# Patient Record
Sex: Female | Born: 1975 | Race: White | Hispanic: Yes | Marital: Married | Smoking: Never smoker
Health system: Southern US, Community
[De-identification: ages and names within clinical notes are randomized; demographics above are authoritative.]

## PROBLEM LIST (undated history)

## (undated) ENCOUNTER — Inpatient Hospital Stay (HOSPITAL_COMMUNITY): Payer: Self-pay

## (undated) DIAGNOSIS — J45909 Unspecified asthma, uncomplicated: Secondary | ICD-10-CM

## (undated) HISTORY — PX: NO PAST SURGERIES: SHX2092

---

## 1999-01-26 ENCOUNTER — Ambulatory Visit (HOSPITAL_COMMUNITY): Admission: RE | Admit: 1999-01-26 | Discharge: 1999-01-26 | Payer: Self-pay | Admitting: *Deleted

## 1999-03-21 ENCOUNTER — Ambulatory Visit (HOSPITAL_COMMUNITY): Admission: RE | Admit: 1999-03-21 | Discharge: 1999-03-21 | Payer: Self-pay | Admitting: *Deleted

## 1999-07-03 ENCOUNTER — Encounter (HOSPITAL_COMMUNITY): Admission: RE | Admit: 1999-07-03 | Discharge: 1999-07-07 | Payer: Self-pay | Admitting: Obstetrics & Gynecology

## 1999-07-05 ENCOUNTER — Inpatient Hospital Stay (HOSPITAL_COMMUNITY): Admission: AD | Admit: 1999-07-05 | Discharge: 1999-07-07 | Payer: Self-pay | Admitting: *Deleted

## 2004-06-09 ENCOUNTER — Inpatient Hospital Stay (HOSPITAL_COMMUNITY): Admission: AD | Admit: 2004-06-09 | Discharge: 2004-06-11 | Payer: Self-pay | Admitting: Obstetrics

## 2011-09-21 ENCOUNTER — Encounter (HOSPITAL_COMMUNITY): Payer: Self-pay | Admitting: *Deleted

## 2011-09-21 ENCOUNTER — Emergency Department (HOSPITAL_COMMUNITY): Payer: Self-pay

## 2011-09-21 ENCOUNTER — Emergency Department (HOSPITAL_COMMUNITY)
Admission: EM | Admit: 2011-09-21 | Discharge: 2011-09-21 | Disposition: A | Discharge status: Home / Self Care | Payer: Self-pay | Attending: Emergency Medicine | Admitting: Emergency Medicine

## 2011-09-21 ENCOUNTER — Other Ambulatory Visit: Payer: Self-pay

## 2011-09-21 DIAGNOSIS — R0602 Shortness of breath: Secondary | ICD-10-CM | POA: Insufficient documentation

## 2011-09-21 DIAGNOSIS — R110 Nausea: Secondary | ICD-10-CM | POA: Insufficient documentation

## 2011-09-21 DIAGNOSIS — F411 Generalized anxiety disorder: Secondary | ICD-10-CM | POA: Insufficient documentation

## 2011-09-21 DIAGNOSIS — R918 Other nonspecific abnormal finding of lung field: Secondary | ICD-10-CM | POA: Insufficient documentation

## 2011-09-21 DIAGNOSIS — R9389 Abnormal findings on diagnostic imaging of other specified body structures: Secondary | ICD-10-CM

## 2011-09-21 DIAGNOSIS — R079 Chest pain, unspecified: Secondary | ICD-10-CM | POA: Insufficient documentation

## 2011-09-21 LAB — POCT I-STAT, CHEM 8
BUN: 9 mg/dL (ref 6–23)
Calcium, Ion: 1.22 mmol/L (ref 1.12–1.32)
Chloride: 106 mEq/L (ref 96–112)
Creatinine, Ser: 0.6 mg/dL (ref 0.50–1.10)
Glucose, Bld: 99 mg/dL (ref 70–99)

## 2011-09-21 LAB — HCG, SERUM, QUALITATIVE: Preg, Serum: NEGATIVE

## 2011-09-21 MED ORDER — AZITHROMYCIN 250 MG PO TABS: 250.0000 mg | ORAL_TABLET | Freq: Once | ORAL | Status: AC

## 2011-09-21 MED ORDER — LORAZEPAM 1 MG PO TABS: 1.0000 mg | ORAL_TABLET | Freq: Four times a day (QID) | ORAL | Status: AC | PRN

## 2011-09-21 MED ORDER — LORAZEPAM 1 MG PO TABS
1.0000 mg | ORAL_TABLET | Freq: Once | ORAL | Status: AC
  Administered 2011-09-21: 1 mg via ORAL
  Filled 2011-09-21: qty 1

## 2011-09-21 MED ORDER — IOHEXOL 350 MG/ML SOLN
100.0000 mL | Freq: Once | INTRAVENOUS | Status: AC | PRN
  Administered 2011-09-21: 100 mL via INTRAVENOUS

## 2011-09-21 NOTE — ED Notes (Signed)
Pt refuses ABG from respiratory tech, Scarlette [place], [place] notified.

## 2011-09-21 NOTE — ED Notes (Signed)
Pt and family requesting CT scan.

## 2011-09-21 NOTE — Discharge Instructions (Signed)
Your x-ray was initially read as a questionable area in the right lung which could be a sign of early infection.  Were going to treat her with some antibiotics on but would like for you to come back in 2 weeks for another x-ray.

## 2011-09-21 NOTE — ED Notes (Signed)
Rx x 2, pt voiced understanding to f/u with referred MD next week.

## 2011-09-21 NOTE — ED Notes (Signed)
Per the daughter who the patient requested to translate,  Patient with complaints of chest pain and sob when she is eating.  She reports it feels like her food gets stuck and she cannot breath.  She also complains of diff having bm,  She has to force/push a lot.  She also reports nausea at times

## 2011-09-21 NOTE — ED Notes (Signed)
Pt feels that her food is not going through her digestive tract.  She thinks it is stuck in her esophageus area and in the LUQ area.   She is normally in pain and states that her pain right now is a 5/10 but increases when she eats.  Denies any N/V but does have difficulty with bowel movements.  Upon palpation on her abd, there is a mass in the LUQ area.

## 2011-09-21 NOTE — ED Provider Notes (Signed)
History     CSN: [id number]  Arrival date & time 09/21/11  1212   First MD Initiated Contact with Patient 09/21/11 1442      Chief Complaint  Patient presents with  . Shortness of Breath  . Chest Pain  . Nausea    HPI Per the daughter who the patient requested to translate, Patient with complaints of chest pain and sob when she is eating. She reports it feels like her food gets stuck and she cannot breath. She also complains of diff having bm, She has to force/push a lot. She also reports nausea at times  History reviewed. No pertinent past medical history.  History reviewed. No pertinent past surgical history.  No family history on file.  History  Substance Use Topics  . Smoking status: Never Smoker   . Smokeless tobacco: Not on file  . Alcohol Use: No    OB History    Grav Para Term Preterm Abortions TAB SAB Ect Mult Living                  Review of Systems  Unable to perform ROS: Other   (language barrier)  Allergies  Review of patient's allergies indicates no known allergies.  Home Medications   Current Outpatient Rx  Name Route Sig Dispense Refill  . FLUOXETINE HCL 10 MG PO CAPS Oral Take 10 mg by mouth daily.    . TRAZODONE HCL 100 MG PO TABS Oral Take 100 mg by mouth at bedtime.    . AZITHROMYCIN 250 MG PO TABS Oral Take 1 tablet (250 mg total) by mouth once. 2 tablets on day one and then one tablet a day until completely gone 6 tablet 0  . LORAZEPAM 1 MG PO TABS Oral Take 1 tablet (1 mg total) by mouth every 6 (six) hours as needed for anxiety. 30 tablet 0    BP 104/63  Pulse 78  Temp(Src) 98.2 F (36.8 C) (Oral)  Resp 18  Ht 5\' 4"  (1.626 m)  SpO2 99%  Physical Exam  Nursing note and vitals reviewed. Constitutional: She is oriented to person, place, and time. She appears well-developed and well-nourished. No distress.  HENT:  Head: Normocephalic and atraumatic.  Eyes: Pupils are equal, round, and reactive to light.  Neck: Normal range of  motion.  Cardiovascular: Normal rate and intact distal pulses.          Date: 09/21/2011  Rate: 79  Rhythm: normal sinus rhythm  QRS Axis: normal  Intervals: normal  ST/T Wave abnormalities: normal (poor R. wave progression across the anterior leads)  Conduction Disutrbances:none:   Old EKG Reviewed: none available      Pulmonary/Chest: No respiratory distress. She has no wheezes. She has no rales.  Abdominal: Normal appearance. She exhibits no distension. There is no tenderness. There is no rebound and no guarding.  Musculoskeletal: Normal range of motion.  Neurological: She is alert and oriented to person, place, and time. No cranial nerve deficit.  Skin: Skin is warm and dry. No rash noted.  Psychiatric: She has a normal mood and affect. Her behavior is normal.    ED Course  Procedures (including critical care time)   Labs Reviewed  HCG, SERUM, QUALITATIVE  POCT I-STAT, CHEM 8  POCT I-STAT TROPONIN I   Ct Angio Chest W/cm &/or Wo Cm  09/21/2011  *RADIOLOGY REPORT*  Clinical Data: Shortness of breath, dysphagia.  CT ANGIOGRAPHY CHEST  Technique:  Multidetector CT imaging of the chest using the  standard protocol during bolus administration of intravenous contrast. Multiplanar reconstructed images including MIPs were obtained and reviewed to evaluate the vascular anatomy.  Contrast: OMNIPAQUE IOHEXOL 350 MG/ML IV SOLN  Comparison: Chest x-ray earlier today.  Findings: No filling defects in the pulmonary arteries to suggest pulmonary emboli.  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  There is a small branch from the left lower lobe which courses normally into the left atrium.  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 This tangle of vessels in the left lower lobe appears to be predominately  represent pulmonary venous structures, draining both into the left atrium and superiorly into the anomalous vein draining the remainder of the left lung into the left innominate vein.  On the right, a portion of the right upper lobe pulmonary vein drains into both the anomalous left pulmonary vein that drains into the left innominate vein as well as into the left atrium. The remainder of the right lung drain normally into the left atrium.  The right side of the heart is prominent, likely due to the high flow state in the right system.  Flattening/straightening of the interventricular septum suggest increased right heart pressures.  Lungs are clear.  No pleural effusions. Visualized thyroid and chest wall soft tissues unremarkable. No mediastinal, hilar, or axillary adenopathy.  Imaging into the upper abdomen shows no acute findings.  No gross abnormality in the region of the esophagus to explain the patient's dysphagia.  IMPRESSION: No evidence of pulmonary embolus.  Anomalous pulmonary venous anatomy.  Almost all of the left lung drain is by an anomalous vessel in the left side of the mediastinum into the left innominate vein.  A small portion of the left lower lobe drains normally into the left atrium.  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 This malformation appears to communicate to both the left atrium and to the abnormal vein draining much of the left lung into the left innominate vein. Similarly, on the right, a portion of the right upper lobe drains anomalously into the large left pulmonary vein draining into the left innominate vein as well as into the left atrium. Consider thoracic surgical consultation.  Original Report Authenticated By: Cyndie Chime, M.D.   Dg Abd Acute W/chest  09/21/2011  *RADIOLOGY REPORT*  Clinical Data: Shortness of breath and chest pain.  ACUTE ABDOMEN SERIES (ABDOMEN 2 VIEW & CHEST 1 VIEW)  Comparison: None.   Findings: Frontal view of the chest shows ill definition of the right hilar shadows.  Left lung is clear. The cardiopericardial silhouette is within normal limits for size.  Left heart border and left hemidiaphragm are very well defined, but no pleural line can be identified to suggest the presence of a pneumothorax.  Upright film shows no evidence for intraperitoneal free air.  There is no evidence for gaseous bowel dilation to suggest obstruction. No unexpected abdominopelvic calcification.  The visualized bony structures are unremarkable.  IMPRESSION: Ill definition of the right hilum.  This could be related to some central atelectasis or infiltrate.  Consider short-term interval follow-up two-view chest x-ray to further evaluate.  No evidence for bowel obstruction or perforation.  Original Report Authenticated By: ERIC A. MANSELL, M.D.     1. Abnormal chest x-ray  MDM  Initially the patient was going to be discharged home with a course of antibiotics and return for another x-ray in 2 weeks.  The husband is very reluctant he stated they had been to many many doctors he requested a CT scan.  Because the abnormal chest x-ray and her shortness of breath I feel like a CT angiogram would be the best exam to rule out serious pathology.  If that is negative I told the husband would most likely would discharge with something for anxiety and followup with a physician.  I turned the case over to F. Sanford to review angio and dispo accordingly.      Nelia Shi, MD 09/21/11 [id number]

## 2011-09-21 NOTE — ED Provider Notes (Signed)
History     CSN: [id number]  Arrival date & time 09/21/11  1212   First MD Initiated Contact with Patient 09/21/11 1442      Chief Complaint  Patient presents with  . Shortness of Breath  . Chest Pain  . Nausea    (Consider location/radiation/quality/duration/timing/severity/associated sxs/prior treatment) HPI  History reviewed. No pertinent past medical history.  History reviewed. No pertinent past surgical history.  No family history on file.  History  Substance Use Topics  . Smoking status: Never Smoker   . Smokeless tobacco: Not on file  . Alcohol Use: No    OB History    Grav Para Term Preterm Abortions TAB SAB Ect Mult Living                  Review of Systems  Allergies  Review of patient's allergies indicates no known allergies.  Home Medications   Current Outpatient Rx  Name Route Sig Dispense Refill  . FLUOXETINE HCL 10 MG PO CAPS Oral Take 10 mg by mouth daily.    . TRAZODONE HCL 100 MG PO TABS Oral Take 100 mg by mouth at bedtime.    . AZITHROMYCIN 250 MG PO TABS Oral Take 1 tablet (250 mg total) by mouth once. 2 tablets on day one and then one tablet a day until completely gone 6 tablet 0    BP 104/63  Pulse 78  Temp(Src) 98.2 F (36.8 C) (Oral)  Resp 18  Ht 5\' 4"  (1.626 m)  SpO2 99%  Physical Exam  ED Course  Procedures (including critical care time)   Labs Reviewed  HCG, SERUM, QUALITATIVE  POCT I-STAT, CHEM 8  POCT I-STAT TROPONIN I   Ct Angio Chest W/cm &/or Wo Cm  09/21/2011  *RADIOLOGY REPORT*  Clinical Data: Shortness of breath, dysphagia.  CT ANGIOGRAPHY CHEST  Technique:  Multidetector CT imaging of the chest using the standard protocol during bolus administration of intravenous contrast. Multiplanar reconstructed images including MIPs were obtained and reviewed to evaluate the vascular anatomy.  Contrast: OMNIPAQUE IOHEXOL 350 MG/ML IV SOLN  Comparison: Chest x-ray earlier today.  Findings: No filling defects in the  pulmonary arteries to suggest pulmonary emboli.  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  There is a small branch from the left lower lobe which courses normally into the left atrium.  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 This tangle of vessels in the left lower lobe appears to be predominately represent pulmonary venous structures, draining both into the left atrium and superiorly into the anomalous vein draining the remainder of the left lung into the left innominate vein.  On the right, a portion of the right upper lobe pulmonary vein drains into both the anomalous left pulmonary vein that drains into the left innominate vein as well as into the left atrium. The remainder of the right lung drain normally into the left atrium.  The right side of the heart is prominent, likely due to the high flow state in the right system.  Flattening/straightening of the interventricular septum suggest increased right heart pressures.  Lungs are clear.  No pleural effusions. Visualized thyroid and chest wall soft tissues unremarkable. No mediastinal, hilar, or axillary adenopathy.  Imaging into the upper abdomen shows no acute findings.  No gross abnormality in the region  of the esophagus to explain the patient's dysphagia.  IMPRESSION: No evidence of pulmonary embolus.  Anomalous pulmonary venous anatomy.  Almost all of the left lung drain is by an anomalous vessel in the left side of the mediastinum into the left innominate vein.  A small portion of the left lower lobe drains normally into the left atrium.  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 This malformation appears to communicate to both the left atrium and to the abnormal vein draining much of the left lung  into the left innominate vein. Similarly, on the right, a portion of the right upper lobe drains anomalously into the large left pulmonary vein draining into the left innominate vein as well as into the left atrium. Consider thoracic surgical consultation.  Original Report Authenticated By: Cyndie Chime, M.D.   Dg Abd Acute W/chest  09/21/2011  *RADIOLOGY REPORT*  Clinical Data: Shortness of breath and chest pain.  ACUTE ABDOMEN SERIES (ABDOMEN 2 VIEW & CHEST 1 VIEW)  Comparison: None.  Findings: Frontal view of the chest shows ill definition of the right hilar shadows.  Left lung is clear. The cardiopericardial silhouette is within normal limits for size.  Left heart border and left hemidiaphragm are very well defined, but no pleural line can be identified to suggest the presence of a pneumothorax.  Upright film shows no evidence for intraperitoneal free air.  There is no evidence for gaseous bowel dilation to suggest obstruction. No unexpected abdominopelvic calcification.  The visualized bony structures are unremarkable.  IMPRESSION: Ill definition of the right hilum.  This could be related to some central atelectasis or infiltrate.  Consider short-term interval follow-up two-view chest x-ray to further evaluate.  No evidence for bowel obstruction or perforation.  Original Report Authenticated By: ERIC A. MANSELL, M.D.   Results for orders placed during the hospital encounter of 09/21/11  HCG, SERUM, QUALITATIVE      Component Value Range   Preg, Serum NEGATIVE  NEGATIVE   POCT I-STAT, CHEM 8      Component Value Range   Sodium 142  135 - 145 (mEq/L)   Potassium 3.6  3.5 - 5.1 (mEq/L)   Chloride 106  96 - 112 (mEq/L)   BUN 9  6 - 23 (mg/dL)   Creatinine, Ser 1.61  0.50 - 1.10 (mg/dL)   Glucose, Bld 99  70 - 99 (mg/dL)   Calcium, Ion 0.96  0.45 - 1.32 (mmol/L)   TCO2 25  0 - 100 (mmol/L)   Hemoglobin 12.9  12.0 - 15.0 (g/dL)   HCT 40.9  81.1 - 91.4 (%)  POCT I-STAT TROPONIN I      Component  Value Range   Troponin i, poc 0.00  0.00 - 0.08 (ng/mL)   Comment 3            Ct Angio Chest W/cm &/or Wo Cm  09/21/2011  *RADIOLOGY REPORT*  Clinical Data: Shortness of breath, dysphagia.  CT ANGIOGRAPHY CHEST  Technique:  Multidetector CT imaging of the chest using the standard protocol during bolus administration of intravenous contrast. Multiplanar reconstructed images including MIPs were obtained and reviewed to evaluate the vascular anatomy.  Contrast: OMNIPAQUE IOHEXOL 350 MG/ML IV SOLN  Comparison: Chest x-ray earlier today.  Findings: No filling defects in the pulmonary arteries to suggest pulmonary emboli.  Anomalous pulmonary venous anatomy noted.  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  There is a small branch from the left lower lobe which courses normally into the left atrium.  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 This tangle of vessels in the left lower lobe appears to be predominately represent pulmonary venous structures, draining both into the left atrium and superiorly into the anomalous vein draining the remainder of the left lung into the left innominate vein.  On the right, a portion of the right upper lobe pulmonary vein drains into both the anomalous left pulmonary vein that drains into the left innominate vein as well as into the left atrium. The remainder of the right lung drain normally into the left atrium.  The right side of the heart is prominent, likely due to the high flow state in the right system.  Flattening/straightening of the interventricular septum suggest increased right heart pressures.  Lungs are clear.  No pleural effusions. Visualized thyroid and chest wall soft tissues unremarkable. No mediastinal, hilar, or axillary adenopathy.  Imaging into the upper abdomen shows no acute findings.  No gross abnormality  in the region of the esophagus to explain the patient's dysphagia.  IMPRESSION: No evidence of pulmonary embolus.  Anomalous pulmonary venous anatomy.  Almost all of the left lung drain is by an anomalous vessel in the left side of the mediastinum into the left innominate vein.  A small portion of the left lower lobe drains normally into the left atrium.  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 This malformation appears to communicate to both the left atrium and to the abnormal vein draining much of the left lung into the left innominate vein. Similarly, on the right, a portion of the right upper lobe drains anomalously into the large left pulmonary vein draining into the left innominate vein as well as into the left atrium. Consider thoracic surgical consultation.  Original Report Authenticated By: Cyndie Chime, M.D.   Dg Abd Acute W/chest  09/21/2011  *RADIOLOGY REPORT*  Clinical Data: Shortness of breath and chest pain.  ACUTE ABDOMEN SERIES (ABDOMEN 2 VIEW & CHEST 1 VIEW)  Comparison: None.  Findings: Frontal view of the chest shows ill definition of the right hilar shadows.  Left lung is clear. The cardiopericardial silhouette is within normal limits for size.  Left heart border and left hemidiaphragm are very well defined, but no pleural line can be identified to suggest the presence of a pneumothorax.  Upright film shows no evidence for intraperitoneal free air.  There is no evidence for gaseous bowel dilation to suggest obstruction. No unexpected abdominopelvic calcification.  The visualized bony structures are unremarkable.  IMPRESSION: Ill definition of the right hilum.  This could be related to some central atelectasis or infiltrate.  Consider short-term interval follow-up two-view chest x-ray to further evaluate.  No evidence for bowel obstruction or perforation.  Original Report Authenticated By: ERIC A. MANSELL, M.D.      1.  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  They recommend CT surgery evaluation on an outpatient basis.  Though I doubt this is the cause of the patient's symptoms, this could be.  Will prescribe a short course  of ativan for anxiety.  Oxygen saturation is normal so I doubt this to be the cause of the issue.        Izola Price [place], [place] 09/21/11 2149

## 2013-06-25 NOTE — L&D Delivery Note (Signed)
Delivery Note At 10:27 AM a viable female was delivered via Vaginal, Spontaneous Delivery (Presentation: Left Occiput Anterior).  APGAR: 9, 9; weight 7 lb 9.2 oz (3436 g).   Placenta status: Intact, Spontaneous.  Cord: 3 vessels with the following complications: None.  Anesthesia: None  Episiotomy: None Lacerations: 1st degree;Perineal Suture Repair: none Est. Blood Loss (mL): 200  Mom to postpartum.  Baby to Couplet care / Skin to Skin.  Stephanie Mcglone ROCIO 04/21/2014, 9:53 PM

## 2013-12-17 ENCOUNTER — Other Ambulatory Visit (HOSPITAL_COMMUNITY): Payer: Self-pay | Admitting: Nurse Practitioner

## 2013-12-17 DIAGNOSIS — Z3689 Encounter for other specified antenatal screening: Secondary | ICD-10-CM

## 2013-12-17 LAB — OB RESULTS CONSOLE HIV ANTIBODY (ROUTINE TESTING): HIV: NONREACTIVE

## 2013-12-17 LAB — OB RESULTS CONSOLE RPR: RPR: NONREACTIVE

## 2013-12-17 LAB — OB RESULTS CONSOLE RUBELLA ANTIBODY, IGM: Rubella: IMMUNE

## 2013-12-17 LAB — OB RESULTS CONSOLE HEPATITIS B SURFACE ANTIGEN: HEP B S AG: NEGATIVE

## 2013-12-22 ENCOUNTER — Ambulatory Visit (HOSPITAL_COMMUNITY)
Admission: RE | Admit: 2013-12-22 | Discharge: 2013-12-22 | Disposition: A | Discharge status: Home / Self Care | Payer: Medicaid Other | Source: Ambulatory Visit | Attending: Nurse Practitioner | Admitting: Nurse Practitioner

## 2013-12-22 ENCOUNTER — Other Ambulatory Visit (HOSPITAL_COMMUNITY): Payer: Self-pay | Admitting: Nurse Practitioner

## 2013-12-22 DIAGNOSIS — Z3689 Encounter for other specified antenatal screening: Secondary | ICD-10-CM

## 2013-12-28 ENCOUNTER — Other Ambulatory Visit (HOSPITAL_COMMUNITY): Payer: Self-pay | Admitting: Nurse Practitioner

## 2013-12-28 DIAGNOSIS — Z0489 Encounter for examination and observation for other specified reasons: Secondary | ICD-10-CM

## 2013-12-28 DIAGNOSIS — IMO0002 Reserved for concepts with insufficient information to code with codable children: Secondary | ICD-10-CM

## 2014-01-07 LAB — OB RESULTS CONSOLE GC/CHLAMYDIA
Chlamydia: NEGATIVE
Gonorrhea: NEGATIVE

## 2014-01-19 ENCOUNTER — Ambulatory Visit (HOSPITAL_COMMUNITY): Admission: RE | Admit: 2014-01-19 | Payer: MEDICAID | Source: Ambulatory Visit

## 2014-01-21 ENCOUNTER — Other Ambulatory Visit (HOSPITAL_COMMUNITY): Payer: Self-pay | Admitting: Nurse Practitioner

## 2014-01-21 ENCOUNTER — Ambulatory Visit (HOSPITAL_COMMUNITY)
Admission: RE | Admit: 2014-01-21 | Discharge: 2014-01-21 | Disposition: A | Discharge status: Home / Self Care | Payer: Medicaid Other | Source: Ambulatory Visit | Attending: Nurse Practitioner | Admitting: Nurse Practitioner

## 2014-01-21 DIAGNOSIS — Z0000 Encounter for general adult medical examination without abnormal findings: Secondary | ICD-10-CM

## 2014-01-21 DIAGNOSIS — Z3689 Encounter for other specified antenatal screening: Secondary | ICD-10-CM | POA: Diagnosis present

## 2014-02-06 IMAGING — CT CT ANGIO CHEST
2 of 6 series · 18 of 46 positions shown · IV contrast (APPLIED)
Comparison: Chest x-ray earlier today.

CLINICAL DATA: Shortness of breath, dysphagia.

CT ANGIOGRAPHY CHEST
TECHNIQUE: Multidetector CT imaging of the chest using the
standard protocol during bolus administration of intravenous
contrast. Multiplanar reconstructed images including MIPs were
obtained and reviewed to evaluate the vascular anatomy.
Contrast: 100mL OMNIPAQUE IOHEXOL 350 MG/ML IV SOLN

[Series 6: pulm embolism 1.0 b25f thin · axial · 0.63mm/px · z∈[-399,-152]mm · 15 of 271 slices shown]
[im 12/271  lung]
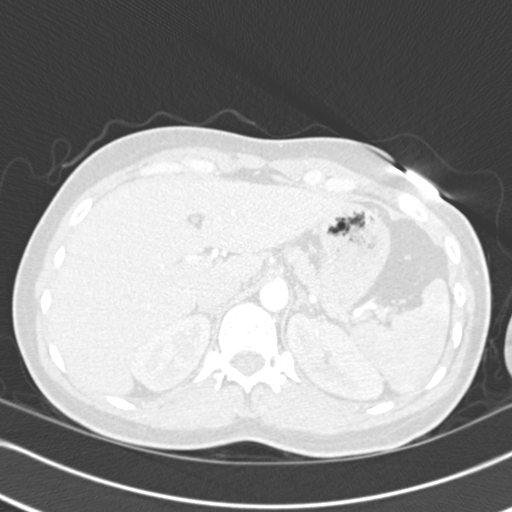
[im 36/271  soft-tissue]
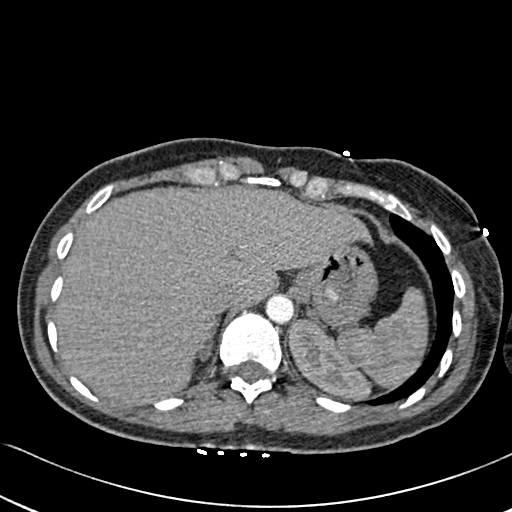
[im 47/271  lung]
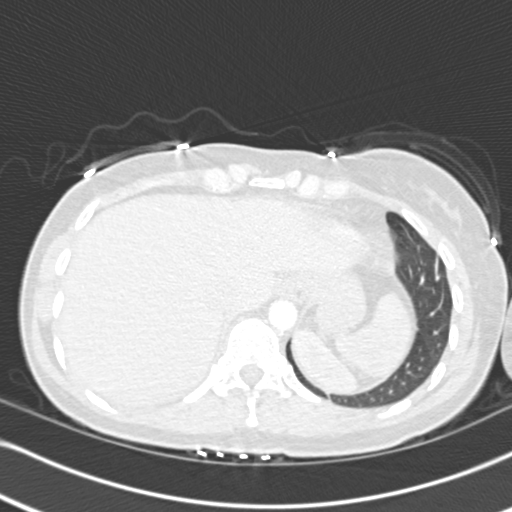
[im 71/271  soft-tissue]
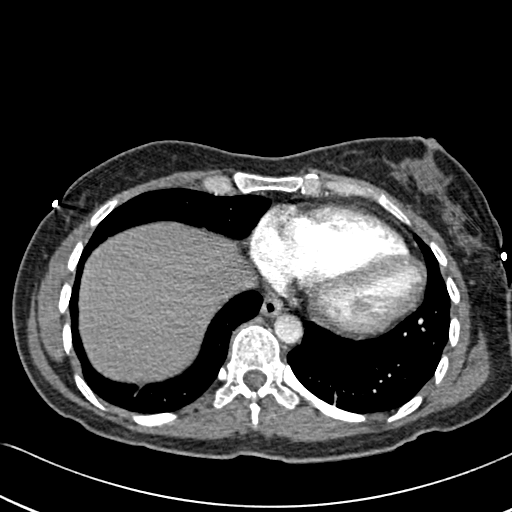
[im 83/271  lung]
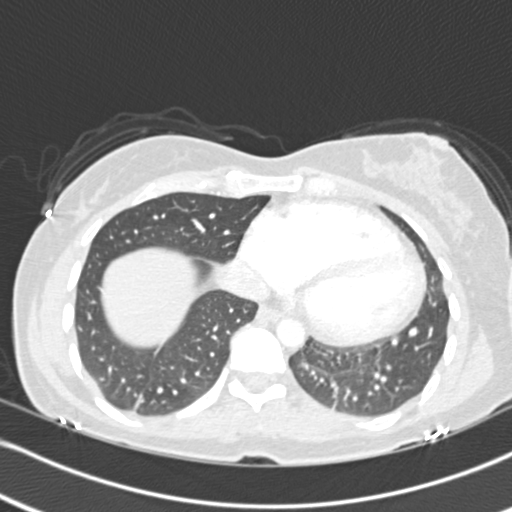
[im 106/271  soft-tissue]
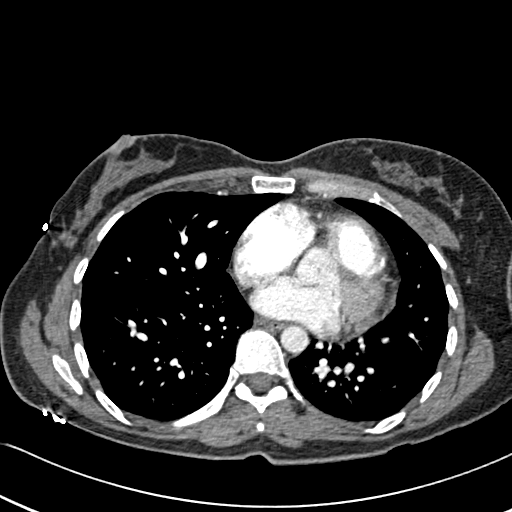
[im 118/271  lung]
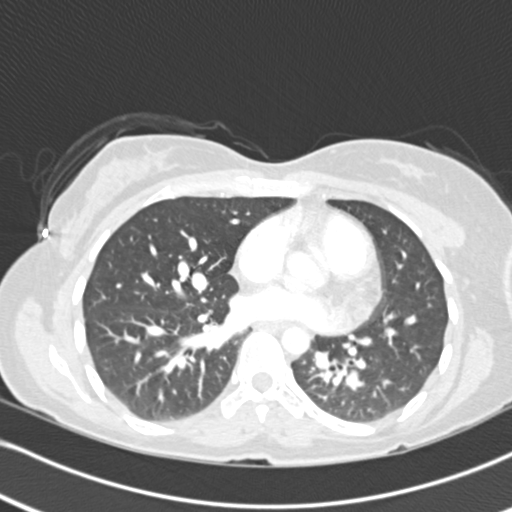
[im 141/271  soft-tissue]
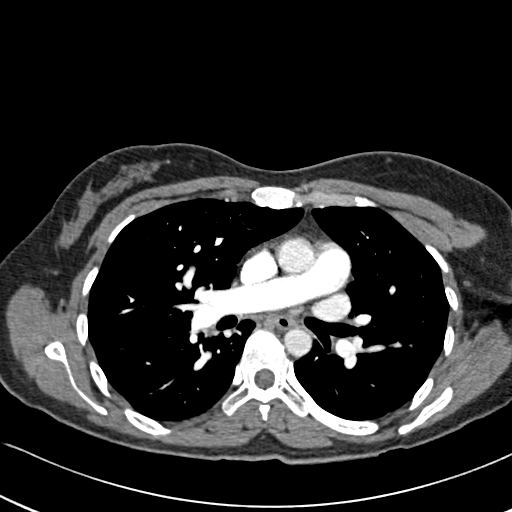
[im 153/271  lung]
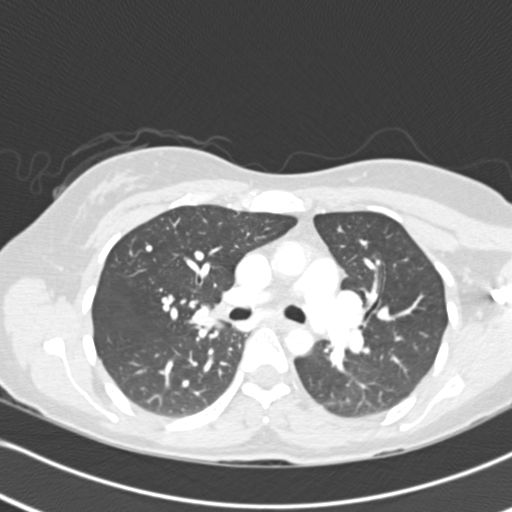
[im 165/271  soft-tissue]
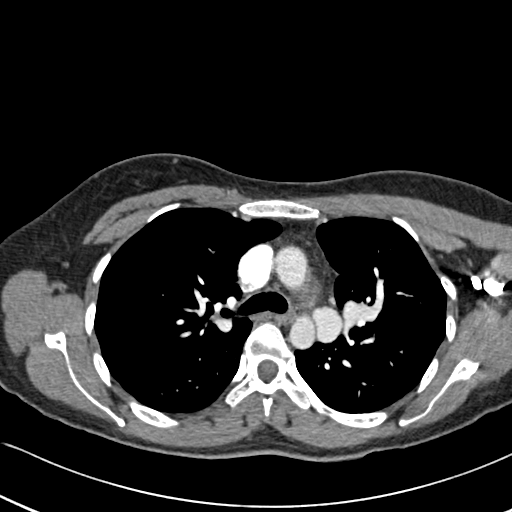
[im 188/271  lung]
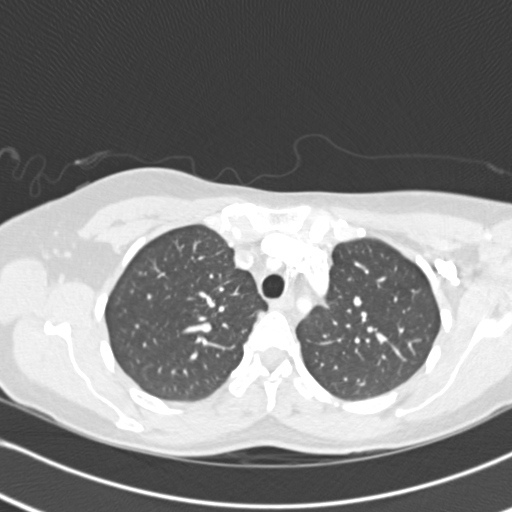
[im 200/271  soft-tissue]
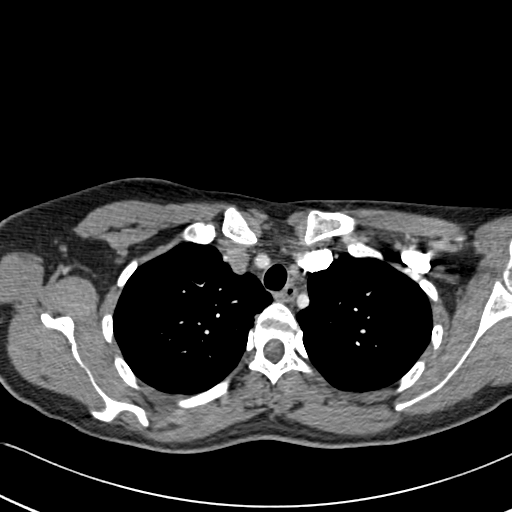
[im 224/271  lung]
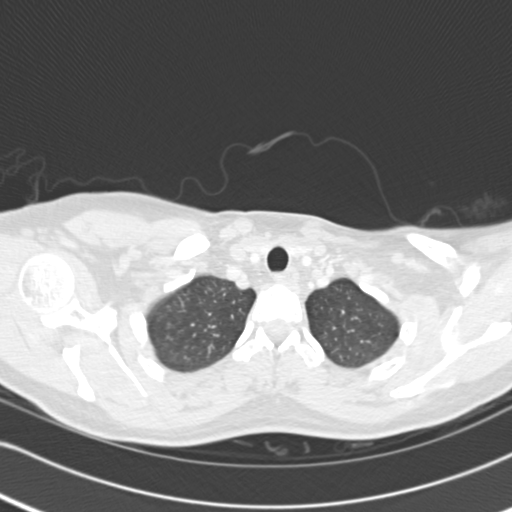
[im 235/271  soft-tissue]
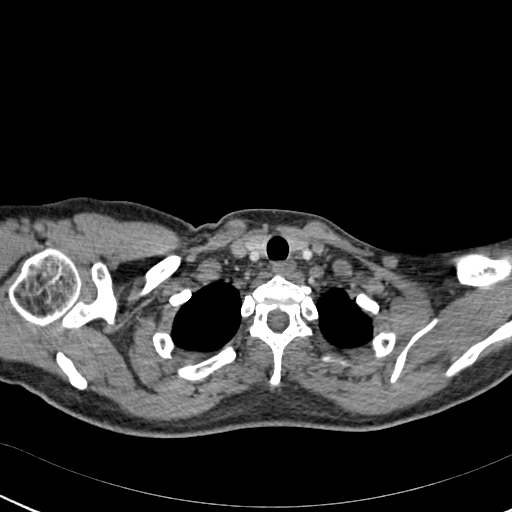
[im 259/271  lung]
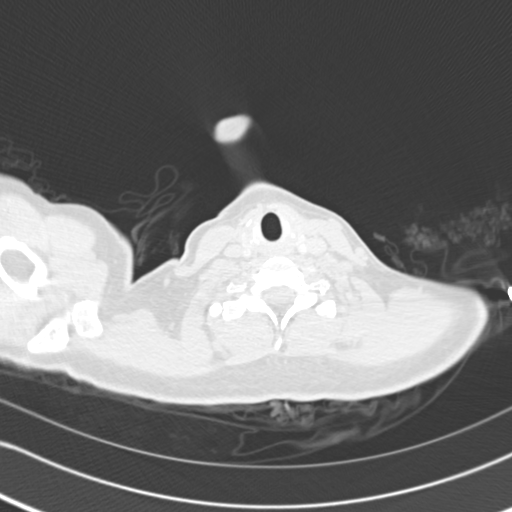

[Series 602: coronal · coronal · 0.63mm/px · 3 of 93 slices shown]
[im 24/93  soft-tissue]
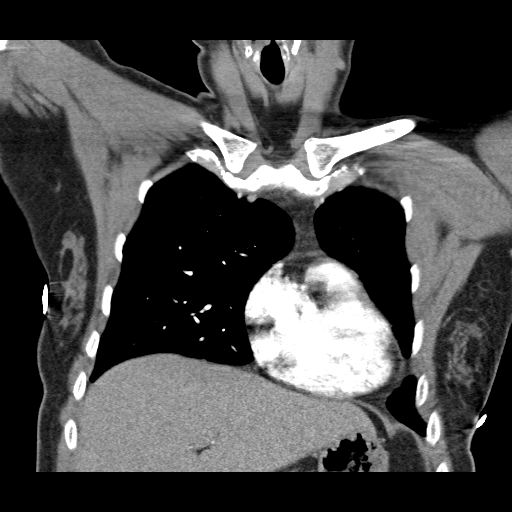
[im 47/93  soft-tissue]
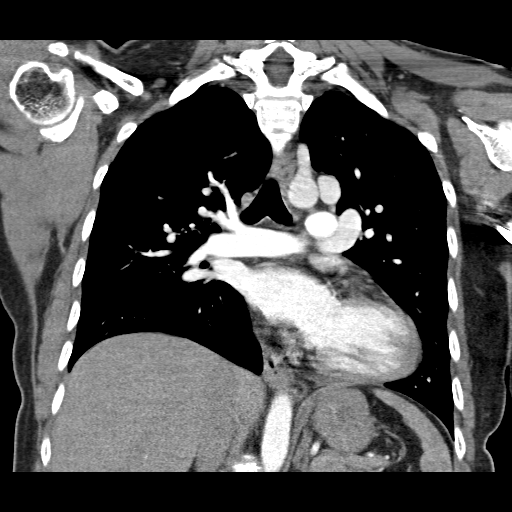
[im 70/93  soft-tissue]
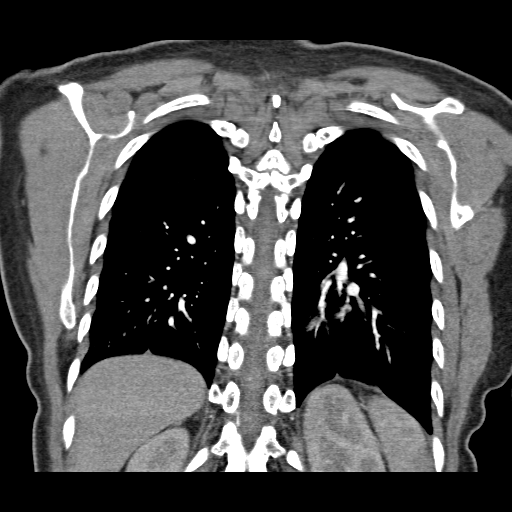

[18 of 46 positions shown; findings below may reference images not displayed]

FINDINGS: No filling defects in the pulmonary arteries to suggest
pulmonary emboli.

Anomalous pulmonary venous anatomy noted.  Near total anomalous
pulmonary venous return of the left lung draining into the left
innominate vein.  There is a small branch from the left lower lobe
which courses normally into the left atrium.  This vessel however
arises from a tangle of vessels in the left lower lobe compatible
with left lower lobe vascular malformation. This tangle of vessels
in the left lower lobe appears to be predominately represent
pulmonary venous structures, draining both into the left atrium and
superiorly into the anomalous vein draining the remainder of the
left lung into the left innominate vein.  On the right, a portion
of the right upper lobe pulmonary vein drains into both the
anomalous left pulmonary vein that drains into the left innominate
vein as well as into the left atrium. The remainder of the right
lung drain normally into the left atrium.  The right side of the
heart is prominent, likely due to the high flow state in the right
system.  Flattening/straightening of the interventricular septum
suggest increased right heart pressures.

Lungs are clear.  No pleural effusions. Visualized thyroid and
chest wall soft tissues unremarkable. No mediastinal, hilar, or
axillary adenopathy.  Imaging into the upper abdomen shows no acute
findings.  No gross abnormality in the region of the esophagus to
explain the patient's dysphagia.
IMPRESSION: No evidence of pulmonary embolus.

Anomalous pulmonary venous anatomy.  Almost all of the left lung
drain is by an anomalous vessel in the left side of the mediastinum
into the left innominate vein.  A small portion of the left lower
lobe drains normally into the left atrium.  However, this vessel
arises from a tangle of vessels in the left lower lobe, likely
vascular malformation. This malformation appears to communicate to
both the left atrium and to the abnormal vein draining much of the
left lung into the left innominate vein. Similarly, on the right, a
portion of the right upper lobe drains anomalously into the large
left pulmonary vein draining into the left innominate vein as well
as into the left atrium. Consider thoracic surgical consultation.

## 2014-02-18 ENCOUNTER — Encounter (HOSPITAL_COMMUNITY): Payer: Self-pay | Admitting: Pediatrics

## 2014-02-18 ENCOUNTER — Inpatient Hospital Stay (HOSPITAL_COMMUNITY)
Admission: AD | Admit: 2014-02-18 | Discharge: 2014-02-18 | Disposition: A | Discharge status: Home / Self Care | Payer: Medicaid Other | Source: Ambulatory Visit | Attending: Obstetrics & Gynecology | Admitting: Obstetrics & Gynecology

## 2014-02-18 ENCOUNTER — Inpatient Hospital Stay (HOSPITAL_COMMUNITY): Payer: Medicaid Other

## 2014-02-18 DIAGNOSIS — N939 Abnormal uterine and vaginal bleeding, unspecified: Secondary | ICD-10-CM

## 2014-02-18 DIAGNOSIS — O4690 Antepartum hemorrhage, unspecified, unspecified trimester: Secondary | ICD-10-CM | POA: Diagnosis not present

## 2014-02-18 DIAGNOSIS — N898 Other specified noninflammatory disorders of vagina: Secondary | ICD-10-CM

## 2014-02-18 DIAGNOSIS — O4700 False labor before 37 completed weeks of gestation, unspecified trimester: Secondary | ICD-10-CM | POA: Diagnosis not present

## 2014-02-18 HISTORY — DX: Unspecified asthma, uncomplicated: J45.909

## 2014-02-18 LAB — RAPID URINE DRUG SCREEN, HOSP PERFORMED
Amphetamines: NOT DETECTED
BARBITURATES: NOT DETECTED
Benzodiazepines: NOT DETECTED
COCAINE: NOT DETECTED
Opiates: NOT DETECTED
Tetrahydrocannabinol: NOT DETECTED

## 2014-02-18 LAB — CBC
HEMATOCRIT: 29.9 % — AB (ref 36.0–46.0)
Hemoglobin: 9.7 g/dL — ABNORMAL LOW (ref 12.0–15.0)
MCH: 28.3 pg (ref 26.0–34.0)
MCHC: 32.4 g/dL (ref 30.0–36.0)
MCV: 87.2 fL (ref 78.0–100.0)
PLATELETS: 208 10*3/uL (ref 150–400)
RBC: 3.43 MIL/uL — AB (ref 3.87–5.11)
RDW: 15 % (ref 11.5–15.5)
WBC: 8.7 10*3/uL (ref 4.0–10.5)

## 2014-02-18 LAB — URINALYSIS, ROUTINE W REFLEX MICROSCOPIC
BILIRUBIN URINE: NEGATIVE
Glucose, UA: NEGATIVE mg/dL
Hgb urine dipstick: NEGATIVE
KETONES UR: 40 mg/dL — AB
LEUKOCYTES UA: NEGATIVE
NITRITE: NEGATIVE
PH: 5.5 (ref 5.0–8.0)
PROTEIN: NEGATIVE mg/dL
Specific Gravity, Urine: 1.025 (ref 1.005–1.030)
UROBILINOGEN UA: 0.2 mg/dL (ref 0.0–1.0)

## 2014-02-18 LAB — ABO/RH: ABO/RH(D): O POS

## 2014-02-18 LAB — WET PREP, GENITAL
CLUE CELLS WET PREP: NONE SEEN
Trich, Wet Prep: NONE SEEN
Yeast Wet Prep HPF POC: NONE SEEN

## 2014-02-18 NOTE — MAU Note (Signed)
Pt states she went to the clinic this morning and when she came home she noticed blood in her urine and in her underwear around 1545. She denies pain or cramping.

## 2014-02-18 NOTE — MAU Note (Signed)
Fetus moving , changing position. FH monitor, repositioned.

## 2014-02-18 NOTE — MAU Note (Signed)
Baby tracing breech at this time

## 2014-02-18 NOTE — Discharge Instructions (Signed)
Hemorragia vaginal durante el embarazo (tercer trimestre) °(Vaginal Bleeding During Pregnancy, Third Trimester) °Durante el embarazo es relativamente frecuente que se presente una pequeña hemorragia (manchas). Hay diversos factores que pueden causar hemorragia o manchas en el embarazo. A veces, la hemorragia es normal y no es un problema. No obstante, si esto sucede durante el tercer trimestre, puede ser un signo de afección grave de la madre y el bebé. Debe informar a su médico de inmediato si tiene alguna hemorragia vaginal.  °Algunas causas posibles de hemorragia vaginal durante el tercer trimestre incluyen:  °· La placenta puede estar cubriendo la apertura del útero de manera total o parcial (placenta previa). °· La placenta puede haberse separado del útero (abrupción placentaria). °· Puede haber infección o una masa en el cuello del útero. °· Puede ser que se esté iniciando el trabajo de parto, entonces se denomina "pérdida del tapón mucoso". °· La placenta puede desarrollarse en la capa muscular del útero (placenta acreta). °INSTRUCCIONES PARA EL CUIDADO EN EL HOGAR  °Controle su afección para ver si hay cambios. Las siguientes indicaciones ayudarán a aliviar cualquier molestia que pueda sentir:  °· Siga las indicaciones del médico para restringir su actividad. Si el médico le indica descanso en la cama, debe quedarse en la cama y levantarse solo para ir al baño. No obstante, el médico puede permitirle que continúe realizando tareas livianas. °· Si es necesario, organícese para que alguien le ayude con las actividades y responsabilidades cotidianas mientras está en cama. °· Lleve un registro de la cantidad y el grado de saturación de las toallas higiénicas que utiliza cada día. Anote este dato. °· No use tampones.No se haga duchas vaginales. °· No tenga relaciones sexuales u orgasmos hasta que el médico la autorice. °· Siga los consejos del médico acerca de levantar objetos pesados, conducir y realizar  actividad física. °· Si elimina tejido por la vagina, guárdelo para mostrárselo al médico. °· Tome solo medicamentos de venta libre o recetados, según las indicaciones del médico. °· No  tome aspirina, ya que puede causar hemorragias. °· Cumpla con todas las visitas de control, según le indique su médico. °SOLICITE ATENCIÓN MÉDICA SI: °· Tiene un sangrado vaginal en cualquier momento del embarazo. °· Tiene calambres o dolores de parto. °· Tiene fiebre y no puede bajarla con medicamentos. °SOLICITE ATENCIÓN MÉDICA DE INMEDIATO SI:  °· Siente calambres intensos o dolor en la espalda o en el vientre (abdomen). °· Tiene escalofríos. °· Tiene una pérdida de líquido por la vagina. °· Elimina coágulos grandes o tejido por la vagina. °· La hemorragia aumenta. °· Se siente mareada o débil. °· Se desmaya. °· Siente que el bebé se mueve menos o no se mueve en absoluto. °ASEGÚRESE DE QUE: °· Comprende estas instrucciones. °· Controlará su afección. °· Recibirá ayuda de inmediato si no mejora o si empeora. °Document Released: 03/21/2005 Document Revised: 06/16/2013 °ExitCare® Patient Information ©2015 [employer]. This information is not intended to replace advice given to you by your health care provider. Make sure you discuss any questions you have with your health care provider. ° °

## 2014-02-18 NOTE — MAU Provider Note (Signed)
Chief Complaint:  Vaginal Bleeding   First Provider Initiated Contact with Patient 02/18/14 1745      HPI: Mackenzie Mitchell is a 38 y.o. G3P2002 pt of [facility] at 28 weeks who presents to maternity admissions reporting light vaginal bleeding when wiping.  She was seen at health dept for prenatal visit today and started having spotting when wiping after her visit.  She did not have a cervical exam during the visit and denies intercourse or anything per vagina x48 hours.  She also reports lower abdominal pain described as bilateral inguinal pain, which is intermittent and sharp, coming occasionally.  She reports good fetal movement, denies LOF, vaginal itching/burning, urinary symptoms, h/a, dizziness, n/v, or fever/chills.      Past Medical History: Past Medical History  Diagnosis Date  . Asthma     Past obstetric history: OB History  Gravida Para Term Preterm AB SAB TAB Ectopic Multiple Living  3         2    # Outcome Date GA Lbr Len/2nd Weight Sex Delivery Anes PTL Lv  3 GRA           2 GRA           1 GRA               Past Surgical History: Past Surgical History  Procedure Laterality Date  . No past surgeries      Family History: Family History  Problem Relation Age of Onset  . Diabetes Mother   . Cancer Father     Social History: History  Substance Use Topics  . Smoking status: Never Smoker   . Smokeless tobacco: Not on file  . Alcohol Use: No    Allergies: No Known Allergies  Meds:  Prescriptions prior to admission  Medication Sig Dispense Refill  . Prenatal Vit-Fe Fumarate-FA (PRENATAL MULTIVITAMIN) TABS tablet Take 1 tablet by mouth daily at 12 noon.        ROS: Pertinent findings in history of present illness.  Physical Exam  Blood pressure 117/86, pulse 83, temperature 98 F (36.7 C), temperature source Oral, resp. rate 16. GENERAL: Well-developed, well-nourished female in no acute distress.  HEENT: normocephalic HEART: normal rate RESP: normal  effort ABDOMEN: Soft, non-tender, gravid appropriate for gestational age EXTREMITIES: Nontender, no edema NEURO: alert and oriented Pelvic exam: Cervix pink, visually closed, without lesion, scant white creamy discharge, No blood noted, vaginal walls and external genitalia normal   Cervix 0/long/high, posterior, no blood on glove following exam  FHT:  Baseline 135 , moderate variability, accelerations present, no decelerations  While pt on EFM in MAU, FHR became difficult to trace for several minutes, when FHR found, there was an audible arrythmia  Contractions: mild irritability upon arrival in MAU, some irregular contractions, mild to palpation 3-4 hours after arriving in MAU   Labs: Results for orders placed during the hospital encounter of 02/18/14 (from the past 24 hour(s))  URINALYSIS, ROUTINE W REFLEX MICROSCOPIC     Status: Abnormal   Collection Time    02/18/14  3:05 PM      Result Value Ref Range   Color, Urine YELLOW  YELLOW   APPearance CLOUDY (*) CLEAR   Specific Gravity, Urine 1.025  1.005 - 1.030   pH 5.5  5.0 - 8.0   Glucose, UA NEGATIVE  NEGATIVE mg/dL   Hgb urine dipstick NEGATIVE  NEGATIVE   Bilirubin Urine NEGATIVE  NEGATIVE   Ketones, ur 40 (*) NEGATIVE mg/dL  Protein, ur NEGATIVE  NEGATIVE mg/dL   Urobilinogen, UA 0.2  0.0 - 1.0 mg/dL   Nitrite NEGATIVE  NEGATIVE   Leukocytes, UA NEGATIVE  NEGATIVE  WET PREP, GENITAL     Status: Abnormal   Collection Time    02/18/14  5:05 PM      Result Value Ref Range   Yeast Wet Prep HPF POC NONE SEEN  NONE SEEN   Trich, Wet Prep NONE SEEN  NONE SEEN   Clue Cells Wet Prep HPF POC NONE SEEN  NONE SEEN   WBC, Wet Prep HPF POC FEW (*) NONE SEEN  CBC     Status: Abnormal   Collection Time    02/18/14  5:24 PM      Result Value Ref Range   WBC 8.7  4.0 - 10.5 K/uL   RBC 3.43 (*) 3.87 - 5.11 MIL/uL   Hemoglobin 9.7 (*) 12.0 - 15.0 g/dL   HCT 16.1 (*) 09.6 - 04.5 %   MCV 87.2  78.0 - 100.0 fL   MCH 28.3  26.0 - 34.0  pg   MCHC 32.4  30.0 - 36.0 g/dL   RDW 40.9  81.1 - 91.4 %   Platelets 208  150 - 400 K/uL  ABO/RH     Status: None   Collection Time    02/18/14  5:24 PM      Result Value Ref Range   ABO/RH(D) O POS      BPP pending Report to Dr Gayla Doss and Jaynie Crumble, CNM at 8:30 pm  Sharen Counter Certified Nurse-Midwife 02/18/2014 8:38 PM   10:46 PM US shows normal fluid, normal cervix, no abruption or previa.  Reactive NST.  Pt d/c'd home in stable condition  CRESENZO-DISHMAN,Jenniferlynn Saad

## 2014-02-19 LAB — GC/CHLAMYDIA PROBE AMP
CT Probe RNA: NEGATIVE
GC PROBE AMP APTIMA: NEGATIVE

## 2014-02-22 NOTE — MAU Provider Note (Signed)
Attestation of Attending Supervision of Advanced Practitioner (PA/CNM/NP): Evaluation and management procedures were performed by the Advanced Practitioner under my supervision and collaboration.  I have reviewed the Advanced Practitioner's note and chart, and I agree with the management and plan.  Cambrea Kirt, MD, FACOG Attending Obstetrician & Gynecologist Faculty Practice, [facility]   

## 2014-04-06 LAB — OB RESULTS CONSOLE GBS: STREP GROUP B AG: POSITIVE

## 2014-04-20 ENCOUNTER — Encounter (HOSPITAL_COMMUNITY): Payer: Self-pay | Admitting: *Deleted

## 2014-04-20 ENCOUNTER — Inpatient Hospital Stay (HOSPITAL_COMMUNITY)
Admission: AD | Admit: 2014-04-20 | Discharge: 2014-04-22 | DRG: 775 | Disposition: A | Discharge status: Home / Self Care | Payer: Medicaid Other | Source: Ambulatory Visit | Attending: Obstetrics and Gynecology | Admitting: Obstetrics and Gynecology

## 2014-04-20 DIAGNOSIS — O09523 Supervision of elderly multigravida, third trimester: Secondary | ICD-10-CM | POA: Diagnosis present

## 2014-04-20 DIAGNOSIS — O99824 Streptococcus B carrier state complicating childbirth: Secondary | ICD-10-CM | POA: Diagnosis present

## 2014-04-20 DIAGNOSIS — IMO0001 Reserved for inherently not codable concepts without codable children: Secondary | ICD-10-CM

## 2014-04-20 DIAGNOSIS — Z3A38 38 weeks gestation of pregnancy: Secondary | ICD-10-CM | POA: Diagnosis present

## 2014-04-20 LAB — CBC
HEMATOCRIT: 32.9 % — AB (ref 36.0–46.0)
HEMOGLOBIN: 10.6 g/dL — AB (ref 12.0–15.0)
MCH: 27.5 pg (ref 26.0–34.0)
MCHC: 32.2 g/dL (ref 30.0–36.0)
MCV: 85.2 fL (ref 78.0–100.0)
Platelets: 195 10*3/uL (ref 150–400)
RBC: 3.86 MIL/uL — AB (ref 3.87–5.11)
RDW: 17.2 % — ABNORMAL HIGH (ref 11.5–15.5)
WBC: 10.9 10*3/uL — ABNORMAL HIGH (ref 4.0–10.5)

## 2014-04-20 LAB — TYPE AND SCREEN
ABO/RH(D): O POS
Antibody Screen: NEGATIVE

## 2014-04-20 LAB — RPR

## 2014-04-20 MED ORDER — ONDANSETRON HCL 4 MG/2ML IJ SOLN: 4.0000 mg | INTRAMUSCULAR | Status: DC | PRN

## 2014-04-20 MED ORDER — ZOLPIDEM TARTRATE 5 MG PO TABS: 5.0000 mg | ORAL_TABLET | Freq: Every evening | ORAL | Status: DC | PRN

## 2014-04-20 MED ORDER — OXYTOCIN BOLUS FROM INFUSION: 500.0000 mL | INTRAVENOUS | Status: DC

## 2014-04-20 MED ORDER — ACETAMINOPHEN 325 MG PO TABS: 650.0000 mg | ORAL_TABLET | ORAL | Status: DC | PRN

## 2014-04-20 MED ORDER — DIPHENHYDRAMINE HCL 25 MG PO CAPS: 25.0000 mg | ORAL_CAPSULE | Freq: Four times a day (QID) | ORAL | Status: DC | PRN

## 2014-04-20 MED ORDER — SIMETHICONE 80 MG PO CHEW: 80.0000 mg | CHEWABLE_TABLET | ORAL | Status: DC | PRN

## 2014-04-20 MED ORDER — LACTATED RINGERS IV SOLN: 500.0000 mL | INTRAVENOUS | Status: DC | PRN

## 2014-04-20 MED ORDER — ONDANSETRON HCL 4 MG/2ML IJ SOLN: 4.0000 mg | Freq: Four times a day (QID) | INTRAMUSCULAR | Status: DC | PRN

## 2014-04-20 MED ORDER — LACTATED RINGERS IV SOLN
INTRAVENOUS | Status: DC
  Administered 2014-04-20: 08:00:00 via INTRAVENOUS

## 2014-04-20 MED ORDER — PRENATAL MULTIVITAMIN CH: 1.0000 | ORAL_TABLET | Freq: Every day | ORAL | Status: DC

## 2014-04-20 MED ORDER — TETANUS-DIPHTH-ACELL PERTUSSIS 5-2.5-18.5 LF-MCG/0.5 IM SUSP: 0.5000 mL | Freq: Once | INTRAMUSCULAR | Status: DC

## 2014-04-20 MED ORDER — DIBUCAINE 1 % RE OINT: 1.0000 "application " | TOPICAL_OINTMENT | RECTAL | Status: DC | PRN

## 2014-04-20 MED ORDER — LIDOCAINE HCL (PF) 1 % IJ SOLN
30.0000 mL | INTRAMUSCULAR | Status: AC | PRN
  Administered 2014-04-20: 30 mL via SUBCUTANEOUS
  Filled 2014-04-20: qty 30

## 2014-04-20 MED ORDER — DEXTROSE 5 % IV SOLN
5.0000 10*6.[IU] | Freq: Once | INTRAVENOUS | Status: DC
  Filled 2014-04-20: qty 5

## 2014-04-20 MED ORDER — DEXTROSE 5 % IV SOLN
2.5000 10*6.[IU] | INTRAVENOUS | Status: DC
  Filled 2014-04-20 (×2): qty 2.5

## 2014-04-20 MED ORDER — LANOLIN HYDROUS EX OINT: TOPICAL_OINTMENT | CUTANEOUS | Status: DC | PRN

## 2014-04-20 MED ORDER — PRENATAL MULTIVITAMIN CH
1.0000 | ORAL_TABLET | Freq: Every day | ORAL | Status: DC
  Administered 2014-04-21 – 2014-04-22 (×2): 1 via ORAL
  Filled 2014-04-20 (×2): qty 1

## 2014-04-20 MED ORDER — OXYCODONE-ACETAMINOPHEN 5-325 MG PO TABS: 1.0000 | ORAL_TABLET | ORAL | Status: DC | PRN

## 2014-04-20 MED ORDER — INFLUENZA VAC SPLIT QUAD 0.5 ML IM SUSY: 0.5000 mL | PREFILLED_SYRINGE | INTRAMUSCULAR | Status: DC

## 2014-04-20 MED ORDER — SODIUM CHLORIDE 0.9 % IV SOLN
2.0000 g | Freq: Four times a day (QID) | INTRAVENOUS | Status: DC
  Administered 2014-04-20: 2 g via INTRAVENOUS
  Filled 2014-04-20 (×2): qty 2000

## 2014-04-20 MED ORDER — SENNOSIDES-DOCUSATE SODIUM 8.6-50 MG PO TABS
2.0000 | ORAL_TABLET | ORAL | Status: DC
  Administered 2014-04-22: 2 via ORAL
  Filled 2014-04-20 (×2): qty 2

## 2014-04-20 MED ORDER — OXYTOCIN 40 UNITS IN LACTATED RINGERS INFUSION - SIMPLE MED
62.5000 mL/h | INTRAVENOUS | Status: DC
  Administered 2014-04-20: 62.5 mL/h via INTRAVENOUS
  Filled 2014-04-20: qty 1000

## 2014-04-20 MED ORDER — CITRIC ACID-SODIUM CITRATE 334-500 MG/5ML PO SOLN: 30.0000 mL | ORAL | Status: DC | PRN

## 2014-04-20 MED ORDER — OXYCODONE-ACETAMINOPHEN 5-325 MG PO TABS: 2.0000 | ORAL_TABLET | ORAL | Status: DC | PRN

## 2014-04-20 MED ORDER — WITCH HAZEL-GLYCERIN EX PADS: 1.0000 "application " | MEDICATED_PAD | CUTANEOUS | Status: DC | PRN

## 2014-04-20 MED ORDER — ONDANSETRON HCL 4 MG PO TABS: 4.0000 mg | ORAL_TABLET | ORAL | Status: DC | PRN

## 2014-04-20 MED ORDER — BENZOCAINE-MENTHOL 20-0.5 % EX AERO
1.0000 "application " | INHALATION_SPRAY | CUTANEOUS | Status: DC | PRN
  Administered 2014-04-20: 1 via TOPICAL
  Filled 2014-04-20: qty 56

## 2014-04-20 MED ORDER — IBUPROFEN 600 MG PO TABS
600.0000 mg | ORAL_TABLET | Freq: Four times a day (QID) | ORAL | Status: DC
  Administered 2014-04-20 – 2014-04-22 (×8): 600 mg via ORAL
  Filled 2014-04-20 (×8): qty 1

## 2014-04-20 NOTE — MAU Note (Signed)
UC's since 0400

## 2014-04-20 NOTE — Progress Notes (Signed)
Admission questions asked with Dr.Acosta interpreting at bedside

## 2014-04-20 NOTE — H&P (Signed)
Mackenzie Mitchell is a 38 y.o. female G4P2 with IUP at 3450w6d presenting with active labor. Pt states she has been having regular contractions, since 0400 this morning associated with none vaginal bleeding.  Membranes are intact, with active fetal movement.   [facility]  Prenatal History/Complications: None significant.  Past Medical History: Past Medical History  Diagnosis Date  . Asthma     Past Surgical History: Past Surgical History  Procedure Laterality Date  . No past surgeries      Obstetrical History: OB History   Grav Para Term Preterm Abortions TAB SAB Ect Mult Living   4 2        2       Social History: History   Social History  . Marital Status: Married    Spouse Name: N/A    Number of Children: N/A  . Years of Education: N/A   Social History Main Topics  . Smoking status: Never Smoker   . Smokeless tobacco: Not on file  . Alcohol Use: No  . Drug Use: No  . Sexual Activity: Not on file   Other Topics Concern  . Not on file   Social History Narrative  . No narrative on file    Family History: Family History  Problem Relation Age of Onset  . Diabetes Mother   . Cancer Father     Allergies: No Known Allergies  Prescriptions prior to admission  Medication Sig Dispense Refill  . Prenatal Vit-Fe Fumarate-FA (PRENATAL MULTIVITAMIN) TABS tablet Take 1 tablet by mouth daily at 12 noon.         Review of Systems-As per HPI, otherwise all systems reviewed and are negative.  Blood pressure 121/71, pulse 81, temperature 97.4 F (36.3 C), temperature source Oral, resp. rate 18, height 5\' 2"  (1.575 m), weight 74.844 kg (165 lb). General appearance: alert and cooperative Lungs: clear to auscultation bilaterally Heart: regular rate and rhythm Abdomen: soft, non-tender; bowel sounds normal Extremities: Homans sign is negative, no sign of DVT DTR's wnl Presentation: cephalic Fetal monitoringBaseline: 140 bpm, Variability: Good {> 6 bpm),  Accelerations: Reactive and Decelerations: Absent Dilation: 5.5 Effacement (%): 90 Station: -2 Exam by:: Sarajane MarekS. Carrera, RNC   Prenatal labs: ABO, Rh: --/--/O POS (08/27 1724) Antibody:   Rubella:   RPR:    HBsAg:    HIV:    GBS:    1 hr Glucola 129 Genetic screening: Too late Anatomy US Normal   Prenatal Transfer Tool  Maternal Diabetes: No Genetic Screening: Presented too late Maternal Ultrasounds/Referrals: Normal Fetal Ultrasounds or other Referrals:  None Maternal Substance Abuse:  No Significant Maternal Medications:  None Significant Maternal Lab Results: Lab values include: Group B Strep positive   No results found for this or any previous visit (from the past 24 hour(s)).  Assessment: Mackenzie Mitchell is a 38 y.o. G4P2 at 5850w6d here for in active labor #Labor: SOL #Pain: Analgesia and anesthesia prn #FWB: Category 1 #ID:  GBS positive #MOF: Breast and Bottle #Circ:  Female baby, n/a  Jacquiline Doearker, Caleb 04/20/2014, 8:15 AM   OB fellow attestation:  I have seen and examined this patient; I agree with above documentation in the resident's note.   Mackenzie Mitchell is a 38 y.o. [phone] presented with contractions.  PE: BP 109/53  Pulse 68  Temp(Src) 98.3 F (36.8 C) (Oral)  Resp 16  Ht 5\' 2"  (1.575 m)  Wt 165 lb (74.844 kg)  BMI 30.17 kg/m2  SpO2 100%  Breastfeeding? Unknown Gen: calm  comfortable, NAD Resp: normal effort, no distress Abd: gravid  ROS, labs, PMH reviewed   Plan: - admit to birthing suites for active labor - PCN for GBS  Perry MountACOSTA,Julaine Zimny ROCIO, MD 9:45 PM

## 2014-04-21 LAB — CBC
HCT: 30 % — ABNORMAL LOW (ref 36.0–46.0)
Hemoglobin: 9.6 g/dL — ABNORMAL LOW (ref 12.0–15.0)
MCH: 27.5 pg (ref 26.0–34.0)
MCHC: 32 g/dL (ref 30.0–36.0)
MCV: 86 fL (ref 78.0–100.0)
Platelets: 189 10*3/uL (ref 150–400)
RBC: 3.49 MIL/uL — ABNORMAL LOW (ref 3.87–5.11)
RDW: 17.6 % — AB (ref 11.5–15.5)
WBC: 10.3 10*3/uL (ref 4.0–10.5)

## 2014-04-21 NOTE — Progress Notes (Addendum)
Post Partum Day 1 Subjective:  Mackenzie Mitchell is a 38 y.o. G4P1003 9960w6d s/p SVD.  No acute events overnight.  Pt denies problems with ambulating, voiding or po intake.  She denies nausea or vomiting.  Pain is well controlled.  She has had flatus. She has not had bowel movement.  Lochia Small.  Plan for birth control is undecided.  Method of Feeding: Breast  Objective: Blood pressure 104/57, pulse 64, temperature 98 F (36.7 C), temperature source Oral, resp. rate 18, height 5\' 2"  (1.575 m), weight 74.844 kg (165 lb), SpO2 100.00%, unknown if currently breastfeeding.  Physical Exam:  General: alert, cooperative and no distress Lochia:normal flow Chest: CTAB Heart: RRR no m/r/g Abdomen: +BS, soft, nontender,  Uterine Fundus: firm DVT Evaluation: No evidence of DVT seen on physical exam. Extremities: No edema   Recent Labs  04/20/14 0810  HGB 10.6*  HCT 32.9*    Assessment/Plan:  ASSESSMENT: Mackenzie Albedalberta Persons is a 38 y.o. G4P1003 6760w6d s/p SVD. GBS Positive.  Plan for discharge tomorrow   LOS: 1 day   Aldona BarKoch, Kari L 04/21/2014, 6:07 AM   Seen also by me.  AGree with note Aviva SignsMarie L Giliana Vantil, CNM

## 2014-04-22 ENCOUNTER — Encounter (HOSPITAL_COMMUNITY): Payer: Self-pay | Admitting: Advanced Practice Midwife

## 2014-04-22 NOTE — Discharge Summary (Signed)
Obstetric Discharge Summary Reason for Admission: onset of labor Prenatal Procedures: none Intrapartum Procedures: spontaneous vaginal delivery Postpartum Procedures: none Complications-Operative and Postpartum: none  Hospital Course:  Patient was admitted in active labor and progressed rapidly with no complications.  Delivery Note At 10:27 AM a viable female was delivered via Vaginal, Spontaneous Delivery (Presentation: Left Occiput Anterior). APGAR: 9, 9; weight 7 lb 9.2 oz (3436 g).  Placenta status: Intact, Spontaneous. Cord: 3 vessels with the following complications: None.  Anesthesia: None  Episiotomy: None  Lacerations: 1st degree;Perineal  Suture Repair: none  Est. Blood Loss (mL): 200  Mom to postpartum. Baby to Couplet care / Skin to Skin.  ACOSTA,KRISTY ROCIO  04/21/2014, 9:53 PM  Postpartum, the patient did well with no complications.  On the day of discharge,  Pt denied problems with ambulating, voiding or po intake.  She denied nausea or vomiting.  Pain wass well controlled.  She has had flatus. She has not had bowel movement.  Lochia Small.  Plan for birth control is condoms.    H/H: Lab Results  Component Value Date/Time   HGB 9.6* 04/21/2014  5:37 AM   HCT 30.0* 04/21/2014  5:37 AM    Filed Vitals:   04/22/14 0508  BP: 111/43  Pulse: 58  Temp: 98.5 F (36.9 C)  Resp: 17    Physical Exam: VSS NAD Abd: Appropriately tender, ND, Fundus firm No c/c/e, Neg homan's sign, neg cords Lochia Appropriate  Discharge Diagnoses: Term Pregnancy-delivered  Discharge Information: Date: 04/22/2014 Activity: pelvic rest Diet: routine  Medications: PNV Breast feeding:  Yes Condition: stable Instructions: refer to handout Discharge to: home     Medication List         prenatal multivitamin Tabs tablet  Take 1 tablet by mouth daily at 12 noon.           Follow-up Information   [facility] [place] Schedule an appointment as  soon as possible for a visit in 6 weeks. (for post partum follow up)    Contact information:   [address] [place] [id number] [phone]      Jacquiline DoeParker, Caleb 04/22/2014,11:13 AM  I have seen and examined this patient and I agree with the above. Cam HaiSHAW, KIMBERLY CNM 2:32 AM 04/24/2014

## 2014-04-22 NOTE — H&P (Signed)
Attestation of Attending Supervision of Obstetric Fellow: Evaluation and management procedures were performed by the Obstetric Fellow under my supervision and collaboration.  I have reviewed the Obstetric Fellow's note and chart, and I agree with the management and plan.  Jacob Stinson, DO Attending Physician Faculty Practice, [facility]  

## 2014-04-22 NOTE — Lactation Note (Addendum)
This note was copied from the chart of Mackenzie Mitchell. Lactation Consultation Note  Patient Name: Mackenzie Mitchell ZOXWR'UToday's Date: 04/22/2014 Reason for consult: Follow-up assessment;Other (Comment) (Spanish interpreter present ) Mackenzie Mitchell Spanish interpreter present. Per  Mom having some soreness on the right nipple. , noted a positional strip. Reviewed hand expressing, milk is in steady flow of colostrum , transitional milk. LC reviewed sore nipple and engorgement prevention and tx , referring to Baby and me booklet. Per mom active with WIC , and also Mother informed of post-discharge support and given phone number to the lactation department,  including services for phone call assistance; out-patient appointments; and breastfeeding support group. List of other breastfeeding  resources in the community given in the handout. Encouraged mother to call for problems or concerns related to breastfeeding. Assessed breast tissue and noted a positional strip , instructed on a hand pump , increased to Size #27 .    Maternal Data Has patient been taught Hand Expression?: Yes  Feeding Feeding Type: Breast Fed Length of feed: 20 min  LATCH Score/Interventions                Intervention(s): Breastfeeding basics reviewed     Lactation Tools Discussed/Used Tools: Pump;Flanges Flange Size: 27 Breast pump type: Manual WIC Program: Yes Pump Review: Setup, frequency, and cleaning;Milk Storage Initiated by:: MAI  Date initiated:: 04/22/14   Consult Status Consult Status: Complete Date: 04/22/14    Kathrin Greathouseorio, Jnai [place] Ann 04/22/2014, 1:03 PM

## 2014-04-22 NOTE — Discharge Instructions (Signed)

## 2014-04-22 NOTE — Progress Notes (Signed)
UR chart review completed.  

## 2014-04-26 ENCOUNTER — Encounter (HOSPITAL_COMMUNITY): Payer: Self-pay | Admitting: Advanced Practice Midwife

## 2016-06-08 IMAGING — US US OB FOLLOW-UP
1 series · 12 of 28 positions shown · non-contrast
Comparison: none

[Series 1: us ob follow up · 12 of 42 slices shown]
[im 2/42]
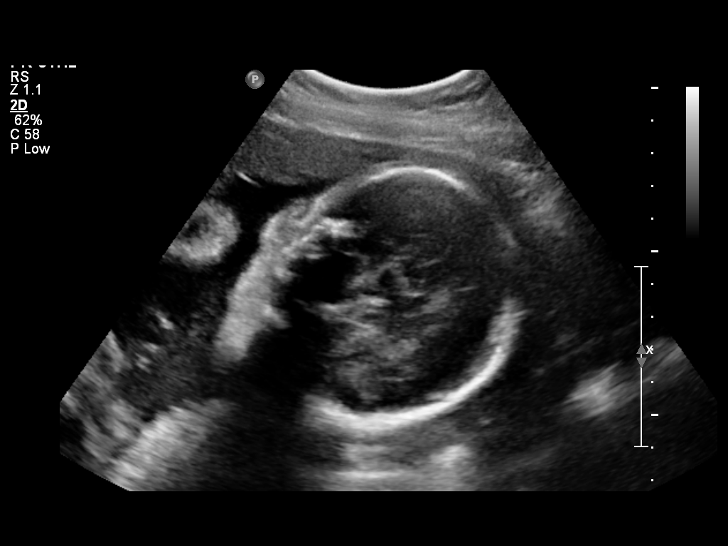
[im 5/42]
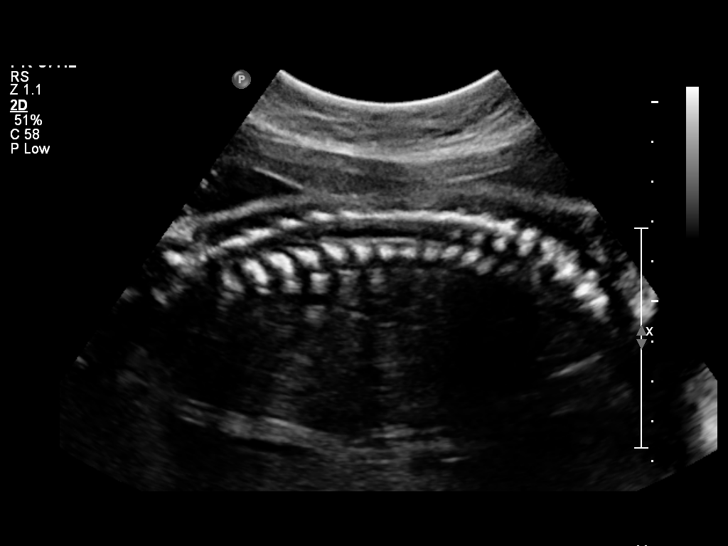
[im 8/42]
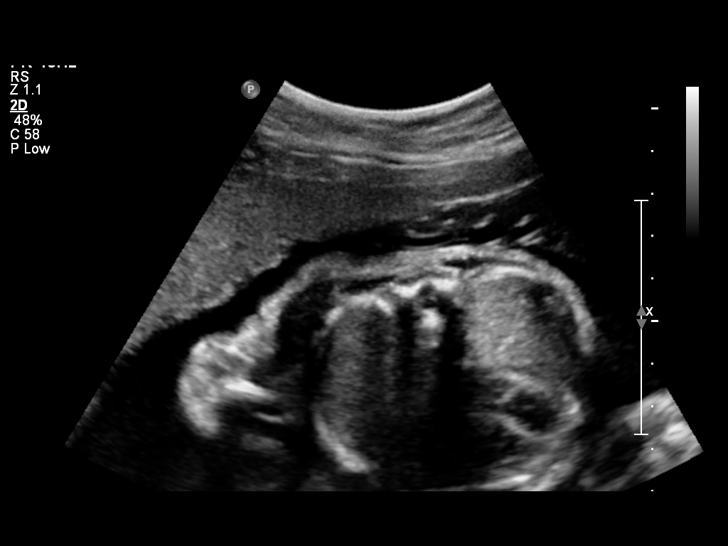
[im 13/42]
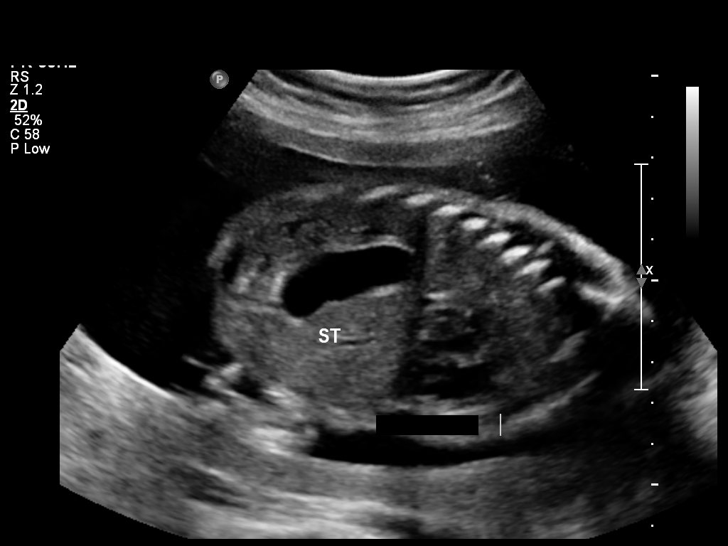
[im 16/42]
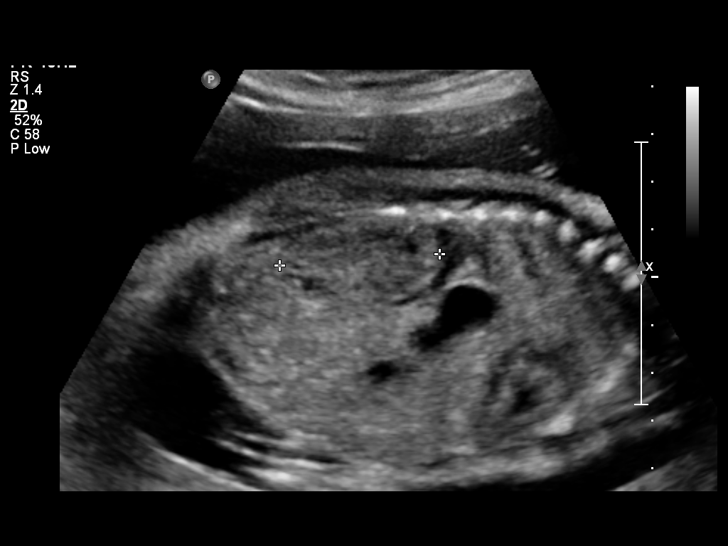
[im 19/42]
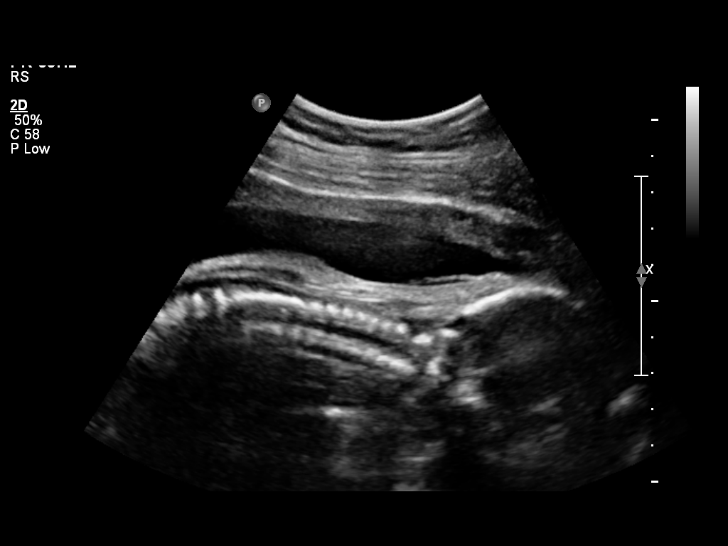
[im 23/42]
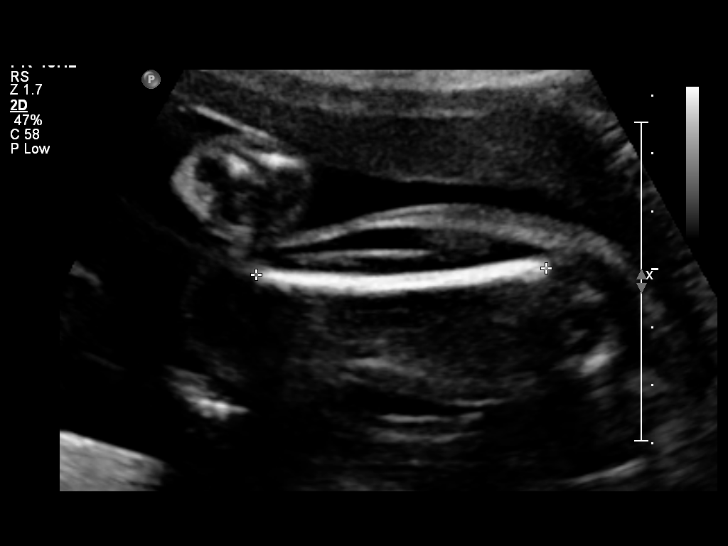
[im 26/42]
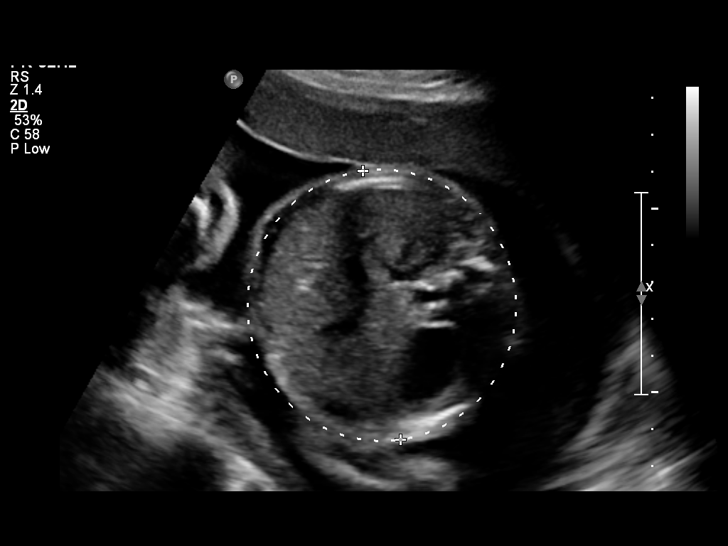
[im 29/42]
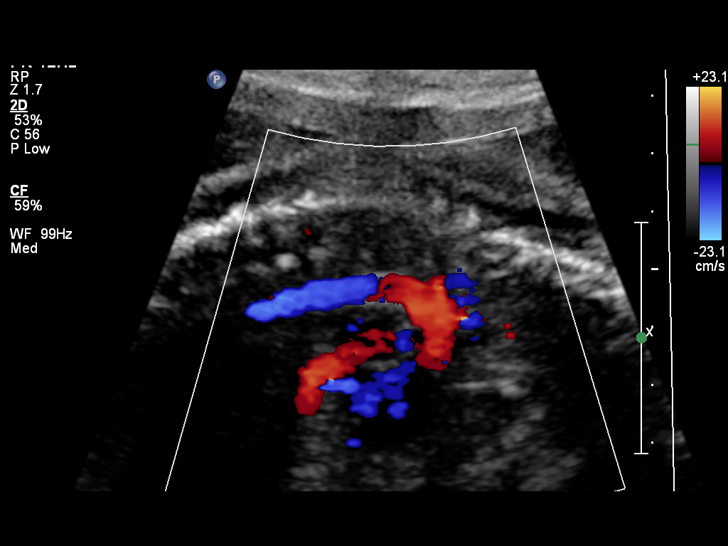
[im 34/42]
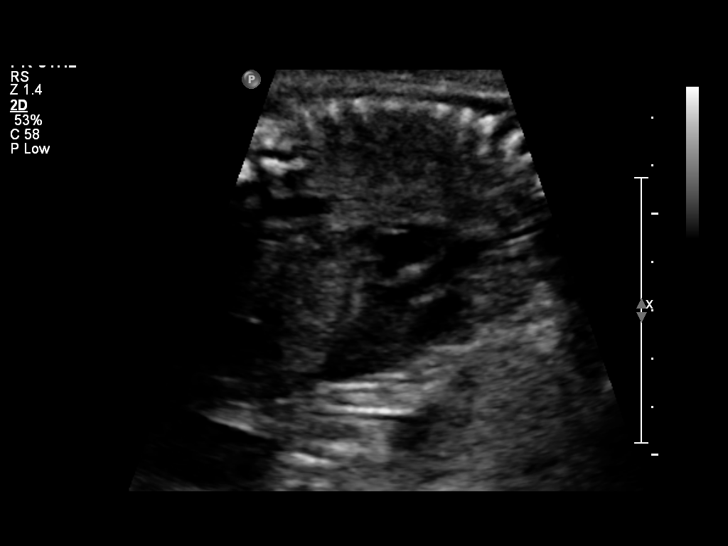
[im 37/42]
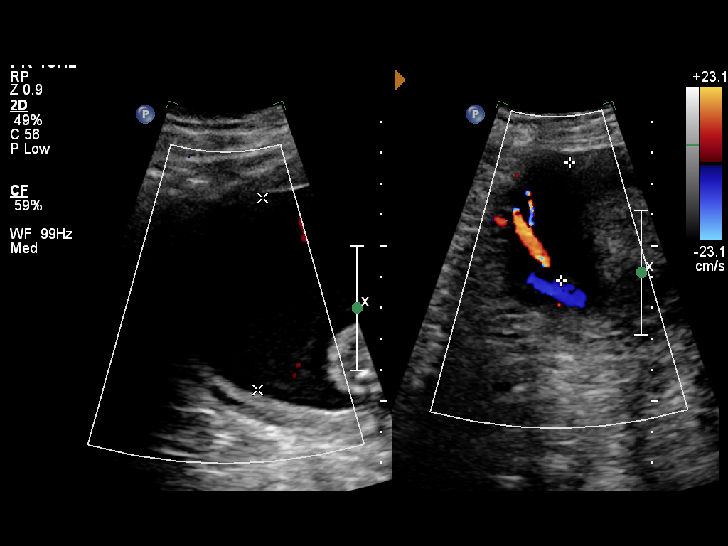
[im 40/42]
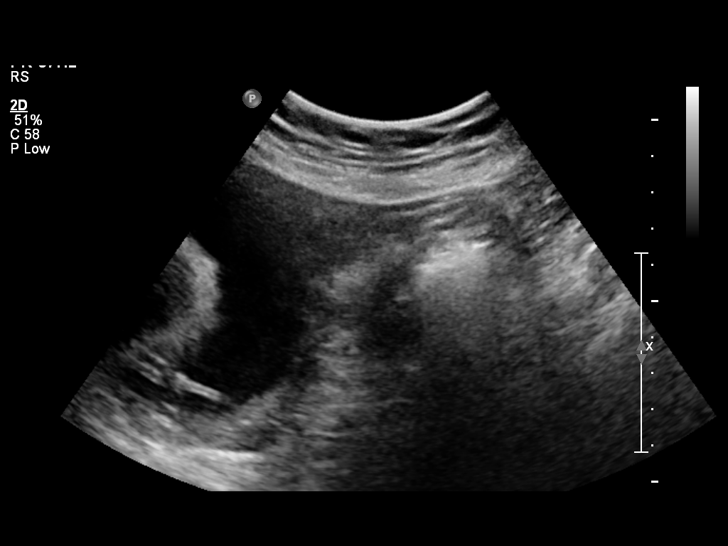

[12 of 28 positions shown; findings below may reference images not displayed]

OBSTETRICS REPORT
                      (Signed Final 01/21/2014 [DATE])

Service(s) Provided

 US OB FOLLOW UP                                       76816.1
Indications

 Evaluate anatomy not seen on prior sonogram
 No or Little Prenatal Care
 Advanced maternal age (AMA), Multigravida
Fetal Evaluation

 Num Of Fetuses:    1
 Fetal Heart Rate:  140                          bpm
 Cardiac Activity:  Observed
 Presentation:      Cephalic
 Placenta:          Anterior, above cervical os
 P. Cord            Previously Visualized
 Insertion:

 Amniotic Fluid
 AFI FV:      Subjectively within normal limits
 AFI Sum:     14.53   cm       49  %Tile     Larg Pckt:    6.19  cm
 RUQ:   6.19    cm   RLQ:    1.82   cm    LUQ:   3.82    cm   LLQ:    2.7    cm
Biometry

 BPD:     62.7  mm     G. Age:  25w 3d                CI:        70.89   70 - 86
                                                      FL/HC:      21.0   18.6 -

 HC:     237.3  mm     G. Age:  25w 6d       17  %    HC/AC:      1.04   1.04 -

 AC:     227.9  mm     G. Age:  27w 1d       73  %    FL/BPD:     79.6   71 - 87
 FL:      49.9  mm     G. Age:  26w 6d       58  %    FL/AC:      21.9   20 - 24
 HUM:     44.7  mm     G. Age:  26w 4d       55  %

 Est. FW:     987  gm      2 lb 3 oz     67  %
Gestational Age

 LMP:           26w 1d        Date:  07/22/13                 EDD:   04/28/14
 U/S Today:     26w 2d                                        EDD:   04/27/14
 Best:          26w 1d     Det. By:  LMP  (07/22/13)          EDD:   04/28/14
Anatomy

 Cranium:          Appears normal         Aortic Arch:      Appears normal
 Fetal Cavum:      Appears normal         Ductal Arch:      Previously seen
 Ventricles:       Appears normal         Diaphragm:        Appears normal
 Choroid Plexus:   Previously seen        Stomach:          Appears normal, left
                                                            sided
 Cerebellum:       Previously seen        Abdomen:          Appears normal
 Posterior Fossa:  Previously seen        Abdominal Wall:   Previously seen
 Nuchal Fold:      Not applicable (>20    Cord Vessels:     Previously seen
                   wks GA)
 Face:             Orbits previously      Kidneys:          Appear normal
                   seen
 Lips:             Previously seen        Bladder:          Appears normal
 Heart:            Previously seen        Spine:            Appears normal
 RVOT:             Previously seen        Lower             Previously seen
                                          Extremities:
 LVOT:             Previously seen        Upper             Previously seen
                                          Extremities:

 Other:  Fetus appears to be a female. Heels visualized. Technically difficult
         due to fetal position.
Targeted Anatomy

 Fetal Central Nervous System
 Lat. Ventricles:
Cervix Uterus Adnexa

 Cervical Length:    3.49     cm

 Cervix:       Normal appearance by transabdominal scan.
 Left Ovary:    Within normal limits.
 Right Ovary:   Within normal limits.
Comments

 The patient's fetal anatomic survey is now complete.  No fetal
 anomalies or soft markers of aneuploidy were seen.  Results
 of scan discussed with patient, as well as the limitations of
 U/S to detect all fetal anomalies and aneuploidies.  No further
 ultrasounds are required unless additional problems arise.
Impression

 Single living intrauterine pregnancy at 26 weeks 1 day.
 Appropriate fetal growth (67%).
 Normal amniotic fluid volume.
 Normal fetal anatomy.
 No fetal anomalies or soft markers of aneuploidy seen.
Recommendations

 Follow-up ultrasounds as clinically indicated.

                Ambre, Boubs

## 2018-01-16 ENCOUNTER — Other Ambulatory Visit: Payer: Self-pay

## 2018-01-16 ENCOUNTER — Ambulatory Visit: Payer: BLUE CROSS/BLUE SHIELD | Admitting: Urgent Care

## 2018-01-16 ENCOUNTER — Encounter: Payer: Self-pay | Admitting: Urgent Care

## 2018-01-16 VITALS — BP 98/60 | HR 76 | Temp 98.1°F | Resp 16 | Ht 62.0 in | Wt 157.0 lb

## 2018-01-16 DIAGNOSIS — R51 Headache: Secondary | ICD-10-CM

## 2018-01-16 DIAGNOSIS — R42 Dizziness and giddiness: Secondary | ICD-10-CM

## 2018-01-16 DIAGNOSIS — I959 Hypotension, unspecified: Secondary | ICD-10-CM

## 2018-01-16 DIAGNOSIS — R110 Nausea: Secondary | ICD-10-CM

## 2018-01-16 DIAGNOSIS — R519 Headache, unspecified: Secondary | ICD-10-CM

## 2018-01-16 DIAGNOSIS — B078 Other viral warts: Secondary | ICD-10-CM

## 2018-01-16 DIAGNOSIS — E860 Dehydration: Secondary | ICD-10-CM

## 2018-01-16 NOTE — Patient Instructions (Addendum)
Tome 2 litros de agua al dia para evitar deshidratacion y presion baja. Puede comer un poco mas sal para ayudar con su presion.     Hipotensin (Hypotension) La hipotensin, conocida comnmente como presin arterial baja, se produce cuando la fuerza de la sangre que bombea en las arterias es demasiado dbil. Las arterias son vasos sanguneos que transportan la sangre desde el corazn al resto del cuerpo. Cuando la presin arterial es demasiado baja, puede ser que no llegue suficiente sangre al cerebro o al resto de sus rganos. Esto puede causar debilidad, sensacin de desvanecimiento, latidos cardacos acelerados y desmayos. Segn la causa y la gravedad, la hipotensin puede ser inofensiva (benigna) o causar problemas graves (crtica). CAUSAS Las posibles causas de la hipotensin incluyen:  Prdida de [place].  Prdida de lquidos corporales (deshidratacin).  Problemas cardacos.  Problemas hormonales (endocrinos).  Embarazo.  Infeccin grave.  Falta de ciertos nutrientes.  Reacciones alrgicas graves (anafilaxis).  Ciertos medicamentos, como medicamentos para la presin arterial o medicamentos que hacen que el cuerpo pierda el exceso de lquidos (diurticos). La hipotensin suele ser la causa de no tomar los medicamentos segn lo indicado, por ejemplo, tomar demasiada cantidad de algn medicamento. FACTORES DE RIESGO Ciertos factores pueden favorecer la aparicin de la hipotensin, por ejemplo:  La edad. El riesgo aumenta a medida que envejece.  Afecciones que afectan el corazn o el sistema nervioso central.  Tomar ciertos medicamentos, como medicamentos para la presin arterial o diurticos.  Estar embarazada. SNTOMAS Los sntomas de esta afeccin pueden incluir los siguientes:  Debilidad.  Sensacin de desvanecimiento.  Mareos.  Visin borrosa.  Fatiga.  Latidos cardacos rpidos.  Desmayos, cuando los casos son graves. DIAGNSTICO Esta afeccin se  diagnostica en funcin de lo siguiente:  Sus antecedentes mdicos.  Sus sntomas.  La medicin de la presin arterial. El mdico le controlar la presin arterial cuando usted est: ? Acostado. ? Sentado. ? De pie. La lectura de la presin arterial se registra con dos nmeros, por ejemplo "120 sobre 80" (o 120/80). El primer nmero ("superior") es la presin sistlica. Es la medida de la presin de las arterias cuando el corazn late. El segundo nmero ("inferior") es la presin diastlica. Es la medida de la presin en las arterias cuando el corazn se relaja entre latidos. La presin sangunea se mide en una unidad llamada mmHg. Una presin arterial saludable para los adultos es de 120/80. Es posible que se le diagnostique hipotensin si su presin arterial est por debajo de90/60. Entre los estudios e informacin que pueden ayudar a Secondary school teacher hipotensin se incluyen los siguientes:  Sus otros signos vitales, por ejemplo la frecuencia cardaca y Retail buyer.  Anlisis de [place].  Prueba de basculacin. Para realizar la prueba, se lo sujeta de forma segura a una mesa que lo mover de una posicin [place] a una posicin vertical. Durante la prueba, se controlarn el ritmo cardaco y la presin arterial. TRATAMIENTO El tratamiento de esta afeccin puede incluir lo siguiente:  Cambios en la dieta. Estos cambios implican comer con ms sal (sodio) o tomar ms agua.  Medicamentos para elevar la presin arterial.  Cambiar la dosis de ciertos medicamentos que toma y que podran bajar su presin arterial.  Usar medias de compresin. Estas medias ayudan a Transport planner formacin de cogulos sanguneos y a Building services engineer de las piernas. En algunos casos, puede ser necesario ir al hospital para:  Reposicin de lquidos. Esto implica que continuar recibiendo lquidos por una va intravenosa.  Reposicin de sangre. Este es un procedimiento en el cual recibir sangre de un donante a  travs de una va intravenosa (transfusin).  El tratamiento de una infeccin o problemas cardacos, si corresponde.  Control. Necesitar supervisin [employer] de los medicamentos que est tomando. INSTRUCCIONES PARA EL CUIDADO EN EL HOGAR Comida y bebida  [place] suficiente lquido para mantener la orina clara o de color amarillo plido.  Mantenga una dieta saludable y siga las indicaciones del mdico respecto de las restricciones para las comidas o las bebidas. Una dieta saludable incluye: ? Nils Pyle y verduras frescas. ? Cereales integrales. ? [employer]J's. ? Productos lcteos descremados.  Consuma la sal adicional nicamente segn las indicaciones. No aada sal adicional a su dieta si no se lo indica el mdico.  Haga comidas pequeas y frecuentes.  Evite ponerse de pie de repente despus de comer. Medicamentos  [employer] de venta libre y los recetados solamente como se lo haya indicado el mdico. ? Siga las indicaciones del mdico respecto del cambio de la dosis de sus medicamentos actuales, si corresponde. ? No deje de tomar los medicamentos ni modifique la dosis por su cuenta. Instrucciones generales  Use las medias de compresin como se lo haya indicado el mdico.  Levntese despacio cuando est acostado o sentado. Esto posibilitar que la presin arterial se adapte.  Evite las duchas calientes o el calor excesivo segn las indicaciones del mdico.  Retome sus actividades normales como se lo haya indicado el mdico. Pregntele al mdico qu actividades son seguras para usted.  No consuma ningn producto que contenga nicotina o tabaco, como cigarrillos y Administrator, Civil Service. Si necesita ayuda para dejar de fumar, consulte al mdico.  Concurra a todas las visitas de control como se lo haya indicado el mdico. Esto es importante. SOLICITE ATENCIN MDICA SI:  Vomita.  Tiene diarrea.  Tiene fiebre por ms de 2 a 3 das.  Tiene ms  sed que lo habitual.  Se siente dbil y cansado. SOLICITE ATENCIN MDICA DE INMEDIATO SI:  Siente dolor en el pecho.  Tiene latidos cardacos rpidos o irregulares.  Siente adormecimiento en alguna parte del cuerpo.  No puede mover los brazos ni las piernas.  Tiene dificultad para hablar.  Est sudoroso o siente mareos.  Se desmaya.  Le falta el aire.  Tiene dificultad para mantenerse despierto.  Se siente confundido. Esta informacin no tiene Theme park manager el consejo del mdico. Asegrese de hacerle al mdico cualquier pregunta que tenga. Document Released: 07/23/2006 Document Revised: 04/01/2013 Document Reviewed: 12/01/2015 Elsevier Interactive Patient Education  2018 [employer].     Deshidratacin en los adultos Dehydration, Adult La deshidratacin es un cuadro clnico que se caracteriza por la cantidad insuficiente de lquido o agua en el organismo. Esto ocurre cuando se pierden ms lquidos de los que se ingieren. Los [employer], como los riones, el cerebro y el corazn, no pueden funcionar sin la cantidad [place] de lquidos. Cualquier prdida de lquidos del organismo puede causar deshidratacin. La deshidratacin puede ser leve o grave. Este cuadro clnico se debe tratar de inmediato para evitar que se agrave. Cules son las causas? Esta afeccin puede ser causada por lo siguiente:  Vmitos.  Diarrea.  Sudoracin excesiva, por ejemplo, por la exposicin al calor o por la actividad fsica.  Ingesta insuficiente de lquido, especialmente en estos casos: ? Cuando se est enfermo. ? Mientras se realizan actividades que exigen mucha [place].  Miccin excesiva.  [place]uts.  Una infeccin.  Determinados medicamentos, como aquellos que estimulan al cuerpo a eliminar el exceso de lquido (diurticos).  Imposibilidad de acceder a agua potable segura.  Disminucin de la capacidad fsica para obtener el agua y los alimentos [place].  Qu  incrementa el riesgo? Es ms probable que Dietitian en las personas que:  Tienen una enfermedad prolongada (crnica) que no est bien tratada, como diabetes, enfermedades cardacas o renales.  [place].  Tienen alguna discapacidad.  Viven a gran altitud.  Practican deportes de resistencia.  Cules son los signos o los sntomas? Los sntomas de la deshidratacin leve pueden incluir los siguientes:  Sed.  Labios secos.  Sequedad leve en la boca.  Piel seca y caliente.  Mareos. Los sntomas de la deshidratacin moderada pueden incluir los siguientes:  [place].  Calambres musculares.  Orina de color oscuro. La orina puede ser color t.  Disminucin de la produccin de [place].  Disminucin en la produccin de lgrimas.  Latido cardaco irregular o ms rpido que lo normal (palpitaciones).  Dolor de [place].  Sensacin de desvanecimiento, especialmente al ponerse de pie.  Desmayos (sncope). Los sntomas de deshidratacin grave pueden incluir los siguientes:  Cambios en la piel, por ejemplo: ? Piel fra y hmeda. ? Piel manchada (moteada) o plida. ? Piel que no vuelve rpidamente a la normalidad cuando se la suelta luego de Counselling psychologist (persistencia del pliegue cutneo).  Cambios en los lquidos corporales, por ejemplo: ? Mucha sed. ? Ausencia de la produccin de lgrimas. ? Incapacidad de transpirar cuando la temperatura corporal es alta, por ejemplo, cuando hace calor. ? Produccin muy escasa de [place].  Cambios en los signos vitales, por ejemplo: ? Pulso dbil. ? Pulso que supera los 100latidos por minuto cuando se est sentado quieto. ? Respiracin rpida. ? Presin arterial baja.  Otros cambios, por ejemplo: ? Ojos hundidos. ? Manos y pies fros. ? Confusin. ? Falta de energa Retail buyer). ? Dificultad para despertarse. ? Prdida de peso a Product manager. ? Prdida del conocimiento. Cmo se  diagnostica? Esta afeccin se diagnostica en funcin de los sntomas y de un examen fsico. Pueden hacerle anlisis de sangre y de orina para confirmar el diagnstico. Cmo se trata esta afeccin? El tratamiento de este cuadro clnico depende de la gravedad. La deshidratacin leve o moderada a menudo puede tratarse en la casa. El tratamiento se debe comenzar de inmediato. No espere hasta que la deshidratacin sea grave. La deshidratacin grave es Radio broadcast assistant y debe tratarse en un hospital. El tratamiento de la deshidratacin leve puede incluir lo siguiente:  Beber ms lquidos.  Reemplazar las sales y los minerales de la sangre (electrolitos) que se pueden haber perdido. El tratamiento de la deshidratacin moderada puede incluir lo siguiente:  Donia Pounds solucin de rehidratacin oral (SRO). Se trata de una bebida que lo ayuda a reponer los lquidos y Water engineer (rehidratarse). Se la puede encontrar en farmacias y tiendas minoristas. El tratamiento de la deshidratacin grave puede incluir lo siguiente:  Recibir lquidos a travs de un tubo (catter) intravenoso.  Recibir una solucin de electrolitos a travs de una sonda de alimentacin que se coloca a travs de la nariz hasta el estmago (sonda nasogstrica o sonda NG).  Corregir las [employer].  Tratar la causa preexistente de la deshidratacin. Siga estas indicaciones en su casa:  Si el mdico se lo indic, beba una SRO: ? Para preparar una SRO, siga las indicaciones del envase. ? Comience por beber  pequeas cantidades, aproximadamente taza (120ml) cada 5 a 10minutos. ? Aumente lentamente la cantidad que bebe hasta que haya ingerido la cantidad recomendada por el mdico.  Beba suficiente lquido transparente para mantener la orina clara o de color amarillo plido. Si le indicaron que beba una SRO, termine primero la solucin y luego empiece a beber lentamente otros lquidos transparentes. Beba lquidos,  por ejemplo: ? [place]. No beba solo agua. Esto puede derivar en la escasez de sal (sodio) en el organismo (hiponatremia). ? Cubitos de hielo. ? Jugo de frutas con agregado de agua (jugo de frutas diluido). ? Bebidas deportivas de bajas caloras.  Evite lo siguiente: ? Alcohol. ? Bebidas con mucha azcar. Entre estas se incluyen las bebidas deportivas ricas en caloras, el jugo de frutas sin diluir y los refrescos o gaseosas. ? Cafena. ? Alimentos muy grasos o azucarados.  Tome los medicamentos de venta libre y los recetados solamente como se lo haya indicado el mdico.  No tome comprimidos de [place]. Esto puede aumentar la concentracin de sodio en el cuerpo (hipernatremia).  Coma alimentos con un equilibrio sano de electrolitos, como bananas, naranjas, papas, tomates y espinaca.  Concurra a todas las visitas de control como se lo haya indicado el mdico. Esto es importante. Comunquese con un mdico si:  Siente dolor abdominal que: ? [place]. ? Se siente en una sola zona (localizado).  Tiene una erupcin cutnea.  Presenta rigidez en el cuello.  Se siente ms irritable o sensible de lo habitual.  Tiene ms sueo o le cuesta ms despertarse de lo habitual.  Se siente dbil o mareado.  Tiene mucha sed.  Solo ha orinado una cantidad pequea de [place] de color muy oscuro en el trmino de 6 a 8horas. Solicite ayuda de inmediato si:  Tiene sntomas de deshidratacin grave.  No puede beber lquidos sin vomitar.  Los sntomas empeoran con Scientist, research (medical)el tratamiento.  Tiene fiebre.  Tiene un dolor de cabeza intenso.  Tiene vmitos o diarrea que: ? Empeoran. ? No desaparecen.  Observa sangre o una sustancia verde (bilis) en el vmito.  Maxie Betterbserva sangre en la materia fecal. Esto puede hacer que la materia fecal sea negra y de aspecto alquitranado.  No ha orinado en el trmino de 6 a 8horas.  Se desmaya.  La frecuencia cardaca mientras est sentado quieto supera los 100latidos  por minuto.  Tiene dificultad para respirar. Esta informacin no tiene Theme park managercomo fin reemplazar el consejo del mdico. Asegrese de hacerle al mdico cualquier pregunta que tenga. Document Released: 06/11/2005 Document Revised: 09/13/2016 Document Reviewed: 08/05/2015 Elsevier Interactive Patient Education  2018 [employer].    Cox CommunicationsMareos Dizziness Los mareos son un problema muy frecuente. Se trata de una sensacin de inestabilidad o desvanecimiento. Puede sentir que se va a desmayar. Los Golden West Financialmareos pueden provocarle una lesin si se tropieza o se cae. Las Dealerpersonas de todas las edades pueden sufrir Research scientist (life sciences)mareos, Biomedical engineerpero es ms frecuente en los adultos [place] Verdemayores. Esta afeccin puede tener muchas causas, entre las que se pueden Assurantmencionar los medicamentos, la deshidratacin y [place]las enfermedades. Siga estas indicaciones en su casa: Comida y bebida  Beba suficiente lquido como para mantener la orina clara o de color amarillo plido. Esto evita la deshidratacin. Trate de beber ms lquidos transparentes, como agua.  No beba alcohol.  Limite el consumo de cafena si el mdico se lo indica. Verifique los ingredientes y la informacin nutricional para saber si un alimento o una bebida contienen cafena.  Limite el consumo de sal (sodio) si el  mdico se lo indica. Verifique los ingredientes y la informacin nutricional para saber si un alimento o una bebida contienen sodio. Actividad  Evite los movimientos rpidos. ? Levntese de las sillas con lentitud y apyese hasta sentirse bien. ? Por la maana, sintese primero a un lado de la cama. Cuando se sienta bien, pngase lentamente de [address] se sostiene de algo, hasta que sepa que ha logrado el equilibrio.  Mueva las piernas con frecuencia si debe estar de pie en un lugar durante mucho tiempo. Mientras est de pie, contraiga y relaje los msculos de las piernas.  No conduzca vehculos ni opere maquinaria pesada si se siente mareado.  Evite agacharse si se siente  mareado. En su casa, coloque los objetos de modo que le resulte fcil alcanzarlos sin Public librarian. Estilo de vida  No consuma ningn producto que contenga nicotina o tabaco, como cigarrillos y Administrator, Civil Service. Si necesita ayuda para dejar de fumar, consulte al [employer].  Trate de reducir el nivel de estrs con mtodos como el yoga o la meditacin. Hable con el mdico si necesita ayuda para controlar el nivel de estrs. Instrucciones generales  Controle sus mareos para ver si hay cambios.  Tome los medicamentos de venta libre y los recetados solamente como se lo haya indicado el mdico. Hable con el mdico si cree que los medicamentos que est tomando son la causa de sus mareos.  Infrmele a un amigo o a un familiar si se siente mareado. Pdale a esta persona que llame al mdico si observa cambios en su comportamiento.  Concurra a todas las visitas de [address] se lo haya indicado el mdico. Esto es importante. Comunquese con un mdico si:  Los [employer].  Los [employer] o la sensacin de Production assistant, radio.  Siente nuseas.  Se le redujo la audicin.  Aparecen nuevos sntomas.  Cuando est de pie, se siente inestable o que la habitacin da vueltas. Solicite ayuda de inmediato si:  Vomita o tiene diarrea y no puede comer ni beber nada.  Tiene dificultad para hablar, caminar, tragar o [employer], las manos o las piernas.  Se siente usualmente dbil.  No piensa con claridad o tiene dificultad para armar oraciones. Es posible que un amigo o un familiar adviertan que esto ocurre.  Tiene dolor de pecho, dolor abdominal, sudoracin o Company secretary.  Sufre cambios en la visin.  Tiene cualquier tipo de sangrado.  Tiene dolor de cabeza intenso.  Tiene dolor o rigidez en el cuello.  Tiene fiebre. Estos sntomas pueden representar un problema grave que constituye Radio broadcast assistant. No espere hasta que los sntomas desaparezcan. Solicite atencin mdica  de inmediato. Comunquese con el servicio de emergencias de su localidad (911 en los [place]). No conduzca por sus propios medios [employer]. Resumen  Los mareos son Neomia Dear sensacin de inestabilidad o desvanecimiento. Esta afeccin puede tener muchas causas, entre las que se pueden [employer]ant, la deshidratacin y [place].  Las Dealer de todas las edades pueden sufrir Research scientist (life sciences), Biomedical engineer es ms frecuente en los adultos [place].  Beba suficiente lquido como para mantener la orina clara o de color amarillo plido. No beba alcohol.  Evite los movimientos rpidos si se siente mareado. Controle sus mareos para ver si hay cambios. Esta informacin no tiene Theme park manager el consejo del mdico. Asegrese de hacerle al mdico cualquier pregunta que tenga. Document Released: 06/11/2005 Document Revised: 09/28/2016 Document Reviewed: 09/28/2016 Elsevier Interactive Patient Education  [employer].  IF you received an x-ray today, you will receive an invoice from [facility]. Please contact [facility] at [phone] with questions or concerns regarding your invoice.   IF you received labwork today, you will receive an invoice from [facility]. Please contact [facility] at [phone] with questions or concerns regarding your invoice.   Our billing staff will not be able to assist you with questions regarding bills from these companies.  You will be contacted with the lab results as soon as they are available. The fastest way to get your results is to activate your My Chart account. Instructions are located on the last page of this paperwork. If you have not heard from us regarding the results in 2 weeks, please contact this office.      

## 2018-01-16 NOTE — Progress Notes (Signed)
    MRN: [id number] DOB: 11/13/1975  Subjective:   Mackenzie Mitchell is a 42 y.o. female presenting for several year history of worsening warts over her hands bilaterally.  Patient reports that she started out with 1 over her right thumb she thought that it would go away on its own.  However over time it has progressed and now has multiple scattered warts over her hands.  She would like to have all of them removed.  She also reports that in the past week she had some headaches, mild sinus congestion, nausea without vomiting and intermittent dizziness.  Denies sinus pain, ear pain, throat pain, cough, chest pain, shortness of breath, belly pain, dysuria.  She reports that she has not hydrate very well and drinks about 1 drink of water per day.  Denies smoking cigarettes or drinking alcohol.  Mackenzie Mitchell is not currently taking any medications.  Also has No Known Allergies.  Mackenzie Mitchell  has a past medical history of Asthma.  Nuys past surgical history.  Family history is positive for diabetes with her mother and cancer with her father.  Objective:   Vitals: BP 98/60   Pulse 76   Temp 98.1 F (36.7 C)   Resp 16   Ht 5\' 2"  (1.575 m)   Wt 157 lb (71.2 kg)   SpO2 96%   BMI 28.72 kg/m   BP Readings from Last 3 Encounters:  01/16/18 98/60  04/22/14 (!) 111/43  02/18/14 97/63    Physical Exam  Constitutional: She is oriented to person, place, and time. She appears well-developed and well-nourished.  HENT:  Mouth/Throat: Oropharynx is clear and moist.  Eyes: Pupils are equal, round, and reactive to light. EOM are normal. Right eye exhibits no discharge. Left eye exhibits no discharge. No scleral icterus.  Neck: Normal range of motion. Neck supple. No thyromegaly present.  Cardiovascular: Normal rate, regular rhythm, normal heart sounds and intact distal pulses. Exam reveals no gallop and no friction rub.  No murmur heard. Pulmonary/Chest: Effort normal and breath sounds normal. No stridor. No  respiratory distress. She has no wheezes. She has no rales.  Abdominal: Soft. Bowel sounds are normal. She exhibits no distension and no mass. There is no tenderness. There is no rebound and no guarding.  Musculoskeletal: She exhibits no edema.  Neurological: She is alert and oriented to person, place, and time.  Skin: Skin is warm and dry. No rash noted. No erythema. No pallor.  Approximately 10 scattered warts of varying sizes between 0.5cm - 1cm over her hands bilaterally.  Psychiatric: She has a normal mood and affect.   Assessment and Plan :   Common wart - Plan: Ambulatory referral to Dermatology  Hypotension, unspecified hypotension type  Dizziness - Plan: Comprehensive metabolic panel, TSH, CBC, Lipid panel  Nonintractable headache, unspecified chronicity pattern, unspecified headache type - Plan: Lipid panel  Nausea without vomiting  Dehydration  Due to the extent of her warts and varying sizes, recommended that we refer her to dermatology for consult and removal.  In the meantime, counseled patient that she may be hypotensive and this may be the source of her dizziness.  Counseled that she needs to hydrate adequately each day and recommended that she increase her salt intake.  She has minimal risk factors, labs pending.  Will review labs with patient by phone.  Wallis BambergMario Abelino Tippin, PA-C [facility] [phone] 01/16/2018  1:34 PM

## 2018-01-17 LAB — COMPREHENSIVE METABOLIC PANEL
ALT: 17 IU/L (ref 0–32)
AST: 17 IU/L (ref 0–40)
Albumin/Globulin Ratio: 1.8 (ref 1.2–2.2)
Albumin: 4.4 g/dL (ref 3.5–5.5)
Alkaline Phosphatase: 65 IU/L (ref 39–117)
BUN/Creatinine Ratio: 13 (ref 9–23)
BUN: 10 mg/dL (ref 6–24)
Bilirubin Total: 0.4 mg/dL (ref 0.0–1.2)
CALCIUM: 10 mg/dL (ref 8.7–10.2)
CO2: 23 mmol/L (ref 20–29)
Chloride: 105 mmol/L (ref 96–106)
Creatinine, Ser: 0.75 mg/dL (ref 0.57–1.00)
GFR, EST AFRICAN AMERICAN: 114 mL/min/{1.73_m2} (ref >59)
GFR, EST NON AFRICAN AMERICAN: 99 mL/min/{1.73_m2} (ref >59)
Globulin, Total: 2.5 g/dL (ref 1.5–4.5)
Glucose: 91 mg/dL (ref 65–99)
Potassium: 4.1 mmol/L (ref 3.5–5.2)
Sodium: 142 mmol/L (ref 134–144)
Total Protein: 6.9 g/dL (ref 6.0–8.5)

## 2018-01-17 LAB — CBC
Hematocrit: 38 % (ref 34.0–46.6)
Hemoglobin: 12.2 g/dL (ref 11.1–15.9)
MCH: 28.4 pg (ref 26.6–33.0)
MCHC: 32.1 g/dL (ref 31.5–35.7)
MCV: 89 fL (ref 79–97)
Platelets: 247 10*3/uL (ref 150–450)
RBC: 4.29 x10E6/uL (ref 3.77–5.28)
RDW: 14.2 % (ref 12.3–15.4)
WBC: 6.2 10*3/uL (ref 3.4–10.8)

## 2018-01-17 LAB — LIPID PANEL
Chol/HDL Ratio: 4 ratio (ref 0.0–4.4)
Cholesterol, Total: 169 mg/dL (ref 100–199)
HDL: 42 mg/dL (ref >39)
LDL Calculated: 74 mg/dL (ref 0–99)
Triglycerides: 267 mg/dL — ABNORMAL HIGH (ref 0–149)
VLDL CHOLESTEROL CAL: 53 mg/dL — AB (ref 5–40)

## 2018-01-17 LAB — TSH: TSH: 2.21 u[IU]/mL (ref 0.450–4.500)
